# Patient Record
Sex: Female | Born: 1970 | Race: White | Hispanic: No | Marital: Married | Smoking: Never smoker
Health system: Southern US, Community
[De-identification: ages and names within clinical notes are randomized; demographics above are authoritative.]

## PROBLEM LIST (undated history)

## (undated) DIAGNOSIS — N179 Acute kidney failure, unspecified: Secondary | ICD-10-CM

## (undated) DIAGNOSIS — E039 Hypothyroidism, unspecified: Secondary | ICD-10-CM

## (undated) DIAGNOSIS — I10 Essential (primary) hypertension: Secondary | ICD-10-CM

## (undated) DIAGNOSIS — G43909 Migraine, unspecified, not intractable, without status migrainosus: Secondary | ICD-10-CM

## (undated) DIAGNOSIS — J45909 Unspecified asthma, uncomplicated: Secondary | ICD-10-CM

## (undated) HISTORY — DX: Essential (primary) hypertension: I10

## (undated) HISTORY — DX: Acute kidney failure, unspecified: N17.9

## (undated) HISTORY — DX: Hypothyroidism, unspecified: E03.9

## (undated) HISTORY — PX: KNEE SURGERY: SHX244

## (undated) HISTORY — DX: Migraine, unspecified, not intractable, without status migrainosus: G43.909

## (undated) HISTORY — DX: Unspecified asthma, uncomplicated: J45.909

## (undated) HISTORY — PX: CHOLECYSTECTOMY: SHX55

---

## 2003-08-24 ENCOUNTER — Other Ambulatory Visit: Admission: RE | Admit: 2003-08-24 | Discharge: 2003-08-24 | Payer: Self-pay | Admitting: Obstetrics and Gynecology

## 2004-02-27 ENCOUNTER — Other Ambulatory Visit: Admission: RE | Admit: 2004-02-27 | Discharge: 2004-02-27 | Payer: Self-pay | Admitting: Obstetrics and Gynecology

## 2004-04-04 ENCOUNTER — Ambulatory Visit: Payer: Self-pay | Admitting: Internal Medicine

## 2004-04-30 ENCOUNTER — Ambulatory Visit: Payer: Self-pay | Admitting: Internal Medicine

## 2004-05-01 ENCOUNTER — Ambulatory Visit: Payer: Self-pay | Admitting: Internal Medicine

## 2004-05-04 ENCOUNTER — Encounter (INDEPENDENT_AMBULATORY_CARE_PROVIDER_SITE_OTHER): Payer: Self-pay | Admitting: Specialist

## 2004-05-04 ENCOUNTER — Observation Stay (HOSPITAL_COMMUNITY): Admission: AD | Admit: 2004-05-04 | Discharge: 2004-05-05 | Payer: Self-pay | Admitting: Surgery

## 2004-05-07 ENCOUNTER — Inpatient Hospital Stay (HOSPITAL_COMMUNITY): Admission: EM | Admit: 2004-05-07 | Discharge: 2004-05-11 | Payer: Self-pay | Admitting: Surgery

## 2004-05-07 ENCOUNTER — Ambulatory Visit: Payer: Self-pay | Admitting: Internal Medicine

## 2004-05-07 ENCOUNTER — Ambulatory Visit (HOSPITAL_COMMUNITY): Admission: RE | Admit: 2004-05-07 | Discharge: 2004-05-07 | Payer: Self-pay | Admitting: Surgery

## 2004-05-29 ENCOUNTER — Ambulatory Visit: Payer: Self-pay | Admitting: Internal Medicine

## 2004-06-07 ENCOUNTER — Ambulatory Visit (HOSPITAL_COMMUNITY): Admission: RE | Admit: 2004-06-07 | Discharge: 2004-06-07 | Payer: Self-pay | Admitting: Surgery

## 2004-06-12 ENCOUNTER — Ambulatory Visit (HOSPITAL_COMMUNITY): Admission: RE | Admit: 2004-06-12 | Discharge: 2004-06-12 | Payer: Self-pay | Admitting: Internal Medicine

## 2004-06-12 ENCOUNTER — Ambulatory Visit: Payer: Self-pay | Admitting: Internal Medicine

## 2004-09-04 ENCOUNTER — Ambulatory Visit: Payer: Self-pay | Admitting: Internal Medicine

## 2004-10-12 ENCOUNTER — Ambulatory Visit: Payer: Self-pay | Admitting: Internal Medicine

## 2004-10-16 ENCOUNTER — Ambulatory Visit: Payer: Self-pay | Admitting: Cardiology

## 2004-10-30 ENCOUNTER — Emergency Department (HOSPITAL_COMMUNITY): Admission: EM | Admit: 2004-10-30 | Discharge: 2004-10-30 | Payer: Self-pay | Admitting: Emergency Medicine

## 2004-11-02 ENCOUNTER — Other Ambulatory Visit: Admission: RE | Admit: 2004-11-02 | Discharge: 2004-11-02 | Payer: Self-pay | Admitting: Obstetrics and Gynecology

## 2004-11-03 ENCOUNTER — Ambulatory Visit (HOSPITAL_COMMUNITY): Admission: RE | Admit: 2004-11-03 | Discharge: 2004-11-03 | Payer: Self-pay | Admitting: Family Medicine

## 2004-11-11 ENCOUNTER — Encounter: Admission: RE | Admit: 2004-11-11 | Discharge: 2004-11-11 | Payer: Self-pay | Admitting: Neurology

## 2004-12-25 ENCOUNTER — Ambulatory Visit: Payer: Self-pay | Admitting: Internal Medicine

## 2005-04-15 ENCOUNTER — Ambulatory Visit: Payer: Self-pay | Admitting: Gastroenterology

## 2005-04-15 ENCOUNTER — Emergency Department (HOSPITAL_COMMUNITY): Admission: EM | Admit: 2005-04-15 | Discharge: 2005-04-15 | Payer: Self-pay | Admitting: Emergency Medicine

## 2005-04-22 ENCOUNTER — Ambulatory Visit: Payer: Self-pay | Admitting: Internal Medicine

## 2005-04-26 ENCOUNTER — Ambulatory Visit: Payer: Self-pay | Admitting: Internal Medicine

## 2005-04-26 ENCOUNTER — Encounter (INDEPENDENT_AMBULATORY_CARE_PROVIDER_SITE_OTHER): Payer: Self-pay | Admitting: Specialist

## 2005-09-02 ENCOUNTER — Ambulatory Visit (HOSPITAL_COMMUNITY): Admission: RE | Admit: 2005-09-02 | Discharge: 2005-09-02 | Payer: Self-pay | Admitting: Family Medicine

## 2006-01-08 ENCOUNTER — Emergency Department (HOSPITAL_COMMUNITY): Admission: EM | Admit: 2006-01-08 | Discharge: 2006-01-09 | Payer: Self-pay | Admitting: Emergency Medicine

## 2007-04-30 ENCOUNTER — Emergency Department (HOSPITAL_COMMUNITY): Admission: EM | Admit: 2007-04-30 | Discharge: 2007-05-01 | Payer: Self-pay | Admitting: Emergency Medicine

## 2007-05-04 ENCOUNTER — Ambulatory Visit: Payer: Self-pay | Admitting: Gastroenterology

## 2007-05-04 LAB — CONVERTED CEMR LAB
ALT: 31 units/L (ref 0–35)
AST: 38 units/L — ABNORMAL HIGH (ref 0–37)
Albumin: 3.9 g/dL (ref 3.5–5.2)
Basophils Absolute: 0 10*3/uL (ref 0.0–0.1)
Basophils Relative: 0.2 % (ref 0.0–1.0)
GFR calc Af Amer: 104 mL/min
HCT: 40.1 % (ref 36.0–46.0)
MCHC: 33.8 g/dL (ref 30.0–36.0)
Monocytes Relative: 7.6 % (ref 3.0–11.0)
Neutrophils Relative %: 63 % (ref 43.0–77.0)
Potassium: 4.1 meq/L (ref 3.5–5.1)
RBC: 4.13 M/uL (ref 3.87–5.11)
RDW: 13.8 % (ref 11.5–14.6)
Sodium: 135 meq/L (ref 135–145)
Total Bilirubin: 0.8 mg/dL (ref 0.3–1.2)

## 2007-05-12 ENCOUNTER — Ambulatory Visit: Payer: Self-pay | Admitting: Internal Medicine

## 2007-05-13 DIAGNOSIS — F329 Major depressive disorder, single episode, unspecified: Secondary | ICD-10-CM

## 2007-05-13 DIAGNOSIS — R1011 Right upper quadrant pain: Secondary | ICD-10-CM

## 2007-05-13 DIAGNOSIS — R1115 Cyclical vomiting syndrome unrelated to migraine: Secondary | ICD-10-CM | POA: Insufficient documentation

## 2007-05-13 DIAGNOSIS — J309 Allergic rhinitis, unspecified: Secondary | ICD-10-CM | POA: Insufficient documentation

## 2007-05-13 DIAGNOSIS — F411 Generalized anxiety disorder: Secondary | ICD-10-CM | POA: Insufficient documentation

## 2007-05-13 DIAGNOSIS — K589 Irritable bowel syndrome without diarrhea: Secondary | ICD-10-CM

## 2007-05-14 ENCOUNTER — Ambulatory Visit: Payer: Self-pay | Admitting: Internal Medicine

## 2007-05-27 ENCOUNTER — Ambulatory Visit: Payer: Self-pay | Admitting: Internal Medicine

## 2007-05-27 LAB — CONVERTED CEMR LAB
Bilirubin, Direct: 0.2 mg/dL (ref 0.0–0.3)
IgA: 181 mg/dL (ref 68–378)
Tissue Transglutaminase Ab, IgA: 0.3 units (ref <7)
Total Bilirubin: 1 mg/dL (ref 0.3–1.2)
Total Protein: 6.7 g/dL (ref 6.0–8.3)

## 2007-05-31 ENCOUNTER — Emergency Department (HOSPITAL_COMMUNITY): Admission: EM | Admit: 2007-05-31 | Discharge: 2007-05-31 | Payer: Self-pay | Admitting: Emergency Medicine

## 2007-06-03 ENCOUNTER — Ambulatory Visit: Payer: Self-pay | Admitting: Gastroenterology

## 2007-06-03 LAB — CONVERTED CEMR LAB
HCV Ab: NEGATIVE
Hep B S Ab: NEGATIVE

## 2007-06-04 ENCOUNTER — Encounter: Payer: Self-pay | Admitting: Gastroenterology

## 2007-06-16 ENCOUNTER — Ambulatory Visit: Payer: Self-pay | Admitting: Internal Medicine

## 2007-06-19 ENCOUNTER — Encounter: Payer: Self-pay | Admitting: Internal Medicine

## 2007-06-19 LAB — CONVERTED CEMR LAB
5-HIAA, 24 Hr Urine: 1.4 mg/(24.h) (ref <=6.0)
Catecholamines Tot(E+NE) 24 Hr U: 0.085 mg/24hr
Dopamine 24 Hr Urine: 210 mcg/24hr (ref <500)
Epinephrine 24 Hr Urine: 5 mcg/24hr (ref <20)
Metaneph Total, Ur: 468 ug/24hr (ref 115–695)
Metanephrines, Ur: 74 (ref 36–190)
Norepinephrine 24 Hr Urine: 80 mcg/24hr — ABNORMAL HIGH (ref <80)
Normetanephrine, 24H Ur: 394 (ref 35–482)

## 2007-07-06 ENCOUNTER — Ambulatory Visit: Payer: Self-pay | Admitting: Internal Medicine

## 2008-10-26 ENCOUNTER — Ambulatory Visit: Payer: Self-pay | Admitting: *Deleted

## 2008-10-26 ENCOUNTER — Inpatient Hospital Stay (HOSPITAL_COMMUNITY): Admission: AD | Admit: 2008-10-26 | Discharge: 2008-10-31 | Payer: Self-pay | Admitting: Internal Medicine

## 2008-10-28 ENCOUNTER — Encounter (INDEPENDENT_AMBULATORY_CARE_PROVIDER_SITE_OTHER): Payer: Self-pay | Admitting: Interventional Radiology

## 2008-10-28 ENCOUNTER — Encounter (INDEPENDENT_AMBULATORY_CARE_PROVIDER_SITE_OTHER): Payer: Self-pay | Admitting: Internal Medicine

## 2008-11-30 ENCOUNTER — Encounter (INDEPENDENT_AMBULATORY_CARE_PROVIDER_SITE_OTHER): Payer: Self-pay | Admitting: Interventional Radiology

## 2008-11-30 ENCOUNTER — Inpatient Hospital Stay (HOSPITAL_COMMUNITY): Admission: EM | Admit: 2008-11-30 | Discharge: 2008-11-30 | Payer: Self-pay | Admitting: Emergency Medicine

## 2008-12-29 ENCOUNTER — Emergency Department (HOSPITAL_COMMUNITY): Admission: EM | Admit: 2008-12-29 | Discharge: 2008-12-29 | Payer: Self-pay | Admitting: Emergency Medicine

## 2009-05-09 ENCOUNTER — Encounter (INDEPENDENT_AMBULATORY_CARE_PROVIDER_SITE_OTHER): Payer: Self-pay | Admitting: Internal Medicine

## 2009-05-09 ENCOUNTER — Observation Stay (HOSPITAL_COMMUNITY): Admission: EM | Admit: 2009-05-09 | Discharge: 2009-05-11 | Payer: Self-pay | Admitting: Emergency Medicine

## 2009-05-10 ENCOUNTER — Ambulatory Visit: Payer: Self-pay | Admitting: Vascular Surgery

## 2009-05-10 ENCOUNTER — Encounter (INDEPENDENT_AMBULATORY_CARE_PROVIDER_SITE_OTHER): Payer: Self-pay | Admitting: Internal Medicine

## 2010-04-22 ENCOUNTER — Encounter: Payer: Self-pay | Admitting: Internal Medicine

## 2010-06-20 LAB — CK TOTAL AND CKMB (NOT AT ARMC)
CK, MB: 1.5 ng/mL (ref 0.3–4.0)
CK, MB: 1.6 ng/mL (ref 0.3–4.0)
Relative Index: INVALID (ref 0.0–2.5)
Relative Index: INVALID (ref 0.0–2.5)
Total CK: 65 U/L (ref 7–177)
Total CK: 83 U/L (ref 7–177)

## 2010-06-20 LAB — CBC
HCT: 32.6 % — ABNORMAL LOW (ref 36.0–46.0)
Hemoglobin: 10.9 g/dL — ABNORMAL LOW (ref 12.0–15.0)
MCHC: 34.9 g/dL (ref 30.0–36.0)
MCV: 94.6 fL (ref 78.0–100.0)
Platelets: 101 10*3/uL — ABNORMAL LOW (ref 150–400)
Platelets: 95 10*3/uL — ABNORMAL LOW (ref 150–400)
RBC: 3.34 MIL/uL — ABNORMAL LOW (ref 3.87–5.11)
RDW: 16.7 % — ABNORMAL HIGH (ref 11.5–15.5)
WBC: 5.1 10*3/uL (ref 4.0–10.5)
WBC: 5.4 10*3/uL (ref 4.0–10.5)

## 2010-06-20 LAB — DIFFERENTIAL
Lymphocytes Relative: 33 % (ref 12–46)
Monocytes Absolute: 0.3 10*3/uL (ref 0.1–1.0)
Monocytes Relative: 6 % (ref 3–12)
Neutro Abs: 2.9 10*3/uL (ref 1.7–7.7)

## 2010-06-20 LAB — COMPREHENSIVE METABOLIC PANEL
Albumin: 3.6 g/dL (ref 3.5–5.2)
BUN: 12 mg/dL (ref 6–23)
BUN: 9 mg/dL (ref 6–23)
Calcium: 9 mg/dL (ref 8.4–10.5)
Calcium: 9.3 mg/dL (ref 8.4–10.5)
Chloride: 108 mEq/L (ref 96–112)
Creatinine, Ser: 0.75 mg/dL (ref 0.4–1.2)
Creatinine, Ser: 0.76 mg/dL (ref 0.4–1.2)
GFR calc non Af Amer: >60 mL/min (ref >60)
Glucose, Bld: 97 mg/dL (ref 70–99)
Sodium: 138 mEq/L (ref 135–145)
Total Bilirubin: 1 mg/dL (ref 0.3–1.2)
Total Protein: 6.7 g/dL (ref 6.0–8.3)

## 2010-06-20 LAB — AMMONIA: Ammonia: 25 umol/L (ref 11–35)

## 2010-06-20 LAB — URINE CULTURE: Colony Count: 75000

## 2010-06-20 LAB — TROPONIN I
Troponin I: 0.02 ng/mL (ref 0.00–0.06)
Troponin I: 0.05 ng/mL (ref 0.00–0.06)

## 2010-06-20 LAB — LACTATE DEHYDROGENASE: LDH: 278 U/L — ABNORMAL HIGH (ref 94–250)

## 2010-06-20 LAB — IRON AND TIBC
Iron: 89 ug/dL (ref 42–135)
Saturation Ratios: 34 % (ref 20–55)
TIBC: 265 ug/dL (ref 250–470)

## 2010-06-20 LAB — URINALYSIS, ROUTINE W REFLEX MICROSCOPIC
Glucose, UA: NEGATIVE mg/dL
Protein, ur: 30 mg/dL — AB

## 2010-06-20 LAB — LIPID PANEL
Cholesterol: 191 mg/dL (ref 0–200)
LDL Cholesterol: 80 mg/dL (ref 0–99)
Triglycerides: 92 mg/dL (ref <150)

## 2010-06-20 LAB — RETICULOCYTES: Retic Count, Absolute: 64.1 10*3/uL (ref 19.0–186.0)

## 2010-06-20 LAB — URINE MICROSCOPIC-ADD ON

## 2010-06-20 LAB — BASIC METABOLIC PANEL
BUN: 8 mg/dL (ref 6–23)
Calcium: 8.6 mg/dL (ref 8.4–10.5)
GFR calc non Af Amer: >60 mL/min (ref >60)
Glucose, Bld: 92 mg/dL (ref 70–99)
Sodium: 137 mEq/L (ref 135–145)

## 2010-06-20 LAB — TSH: TSH: 3.728 u[IU]/mL (ref 0.350–4.500)

## 2010-06-20 LAB — VITAMIN D 1,25 DIHYDROXY
Vitamin D 1, 25 (OH)2 Total: 96 pg/mL — ABNORMAL HIGH (ref 18–72)
Vitamin D3 1, 25 (OH)2: 96 pg/mL

## 2010-06-20 LAB — TECHNOLOGIST SMEAR REVIEW: Path Review: DECREASED

## 2010-06-20 LAB — ANA: Anti Nuclear Antibody(ANA): NEGATIVE

## 2010-07-06 LAB — COMPREHENSIVE METABOLIC PANEL
ALT: 47 U/L — ABNORMAL HIGH (ref 0–35)
AST: 170 U/L — ABNORMAL HIGH (ref 0–37)
Albumin: 1.8 g/dL — ABNORMAL LOW (ref 3.5–5.2)
Alkaline Phosphatase: 213 U/L — ABNORMAL HIGH (ref 39–117)
CO2: 21 mEq/L (ref 19–32)
Chloride: 93 mEq/L — ABNORMAL LOW (ref 96–112)
GFR calc Af Amer: 22 mL/min — ABNORMAL LOW (ref >60)
GFR calc non Af Amer: 18 mL/min — ABNORMAL LOW (ref >60)
Potassium: 4 mEq/L (ref 3.5–5.1)
Sodium: 129 mEq/L — ABNORMAL LOW (ref 135–145)
Total Bilirubin: 29.5 mg/dL (ref 0.3–1.2)

## 2010-07-06 LAB — DIFFERENTIAL
Basophils Relative: 0 % (ref 0–1)
Eosinophils Absolute: 0 10*3/uL (ref 0.0–0.7)
Eosinophils Relative: 0 % (ref 0–5)
Lymphs Abs: 1.2 10*3/uL (ref 0.7–4.0)
Monocytes Absolute: 1.2 10*3/uL — ABNORMAL HIGH (ref 0.1–1.0)
Neutro Abs: 12.4 10*3/uL — ABNORMAL HIGH (ref 1.7–7.7)
Neutrophils Relative %: 84 % — ABNORMAL HIGH (ref 43–77)

## 2010-07-06 LAB — BODY FLUID CULTURE: Gram Stain: NONE SEEN

## 2010-07-06 LAB — CBC
MCV: 103.3 fL — ABNORMAL HIGH (ref 78.0–100.0)
Platelets: 372 10*3/uL (ref 150–400)
RBC: 3.85 MIL/uL — ABNORMAL LOW (ref 3.87–5.11)
WBC: 14.8 10*3/uL — ABNORMAL HIGH (ref 4.0–10.5)

## 2010-07-06 LAB — BODY FLUID CELL COUNT WITH DIFFERENTIAL
Lymphs, Fluid: 36 %
Total Nucleated Cell Count, Fluid: 12 cu mm (ref 0–1000)

## 2010-07-06 LAB — PATHOLOGIST SMEAR REVIEW

## 2010-07-06 LAB — ALBUMIN, FLUID (OTHER): Albumin, Fluid: <1 g/dL

## 2010-07-06 LAB — CULTURE, BLOOD (ROUTINE X 2)

## 2010-07-07 LAB — CBC
MCHC: 32.9 g/dL (ref 30.0–36.0)
MCV: 109 fL — ABNORMAL HIGH (ref 78.0–100.0)
Platelets: 358 10*3/uL (ref 150–400)
Platelets: 330 10*3/uL (ref 150–400)
WBC: 15 10*3/uL — ABNORMAL HIGH (ref 4.0–10.5)
WBC: 9.8 10*3/uL (ref 4.0–10.5)

## 2010-07-07 LAB — COMPREHENSIVE METABOLIC PANEL
ALT: 42 U/L — ABNORMAL HIGH (ref 0–35)
ALT: 58 U/L — ABNORMAL HIGH (ref 0–35)
AST: 115 U/L — ABNORMAL HIGH (ref 0–37)
AST: 82 U/L — ABNORMAL HIGH (ref 0–37)
AST: 90 U/L — ABNORMAL HIGH (ref 0–37)
Albumin: 1.8 g/dL — ABNORMAL LOW (ref 3.5–5.2)
Albumin: 2.9 g/dL — ABNORMAL LOW (ref 3.5–5.2)
Alkaline Phosphatase: 197 U/L — ABNORMAL HIGH (ref 39–117)
Alkaline Phosphatase: 80 U/L (ref 39–117)
Calcium: 8.8 mg/dL (ref 8.4–10.5)
Calcium: 9.1 mg/dL (ref 8.4–10.5)
Chloride: 91 mEq/L — ABNORMAL LOW (ref 96–112)
Chloride: 98 mEq/L (ref 96–112)
Creatinine, Ser: 2.24 mg/dL — ABNORMAL HIGH (ref 0.4–1.2)
Creatinine, Ser: 0.73 mg/dL (ref 0.4–1.2)
GFR calc Af Amer: 30 mL/min — ABNORMAL LOW (ref >60)
GFR calc Af Amer: >60 mL/min (ref >60)
GFR calc Af Amer: >60 mL/min (ref >60)
Potassium: 3.4 mEq/L — ABNORMAL LOW (ref 3.5–5.1)
Potassium: 3.8 mEq/L (ref 3.5–5.1)
Sodium: 125 mEq/L — ABNORMAL LOW (ref 135–145)
Sodium: 134 mEq/L — ABNORMAL LOW (ref 135–145)
Total Bilirubin: 28 mg/dL (ref 0.3–1.2)
Total Protein: 5.4 g/dL — ABNORMAL LOW (ref 6.0–8.3)
Total Protein: 5.8 g/dL — ABNORMAL LOW (ref 6.0–8.3)

## 2010-07-07 LAB — URINALYSIS, ROUTINE W REFLEX MICROSCOPIC
Nitrite: POSITIVE — AB
Specific Gravity, Urine: 1.017 (ref 1.005–1.030)
Urobilinogen, UA: 1 mg/dL (ref 0.0–1.0)

## 2010-07-07 LAB — AMMONIA: Ammonia: 109 umol/L — ABNORMAL HIGH (ref 11–35)

## 2010-07-07 LAB — DIFFERENTIAL
Basophils Absolute: 0 10*3/uL (ref 0.0–0.1)
Eosinophils Relative: 0 % (ref 0–5)
Lymphocytes Relative: 8 % — ABNORMAL LOW (ref 12–46)
Monocytes Absolute: 0.5 10*3/uL (ref 0.1–1.0)

## 2010-07-07 LAB — URINE CULTURE: Colony Count: 60000

## 2010-07-07 LAB — CULTURE, BLOOD (ROUTINE X 2): Culture: NO GROWTH

## 2010-07-07 LAB — TYPE AND SCREEN: Antibody Screen: NEGATIVE

## 2010-07-07 LAB — DIRECT ANTIGLOBULIN TEST (NOT AT ARMC)
DAT, IgG: NEGATIVE
DAT, complement: NEGATIVE

## 2010-07-08 LAB — COMPREHENSIVE METABOLIC PANEL
ALT: 114 U/L — ABNORMAL HIGH (ref 0–35)
ALT: 73 U/L — ABNORMAL HIGH (ref 0–35)
AST: 113 U/L — ABNORMAL HIGH (ref 0–37)
AST: 88 U/L — ABNORMAL HIGH (ref 0–37)
Albumin: 2.6 g/dL — ABNORMAL LOW (ref 3.5–5.2)
Albumin: 2.9 g/dL — ABNORMAL LOW (ref 3.5–5.2)
Alkaline Phosphatase: 76 U/L (ref 39–117)
Alkaline Phosphatase: 76 U/L (ref 39–117)
Alkaline Phosphatase: 84 U/L (ref 39–117)
BUN: 2 mg/dL — ABNORMAL LOW (ref 6–23)
BUN: 3 mg/dL — ABNORMAL LOW (ref 6–23)
BUN: 4 mg/dL — ABNORMAL LOW (ref 6–23)
BUN: 5 mg/dL — ABNORMAL LOW (ref 6–23)
CO2: 22 mEq/L (ref 19–32)
CO2: 23 mEq/L (ref 19–32)
Calcium: 8.1 mg/dL — ABNORMAL LOW (ref 8.4–10.5)
Chloride: 86 mEq/L — ABNORMAL LOW (ref 96–112)
Chloride: 98 mEq/L (ref 96–112)
Creatinine, Ser: 1 mg/dL (ref 0.4–1.2)
Creatinine, Ser: 1.38 mg/dL — ABNORMAL HIGH (ref 0.4–1.2)
GFR calc Af Amer: >60 mL/min (ref >60)
GFR calc Af Amer: >60 mL/min (ref >60)
GFR calc non Af Amer: 43 mL/min — ABNORMAL LOW (ref >60)
GFR calc non Af Amer: >60 mL/min (ref >60)
Glucose, Bld: 103 mg/dL — ABNORMAL HIGH (ref 70–99)
Glucose, Bld: 90 mg/dL (ref 70–99)
Potassium: 3.7 mEq/L (ref 3.5–5.1)
Potassium: 4.2 mEq/L (ref 3.5–5.1)
Potassium: 4.6 mEq/L (ref 3.5–5.1)
Sodium: 130 mEq/L — ABNORMAL LOW (ref 135–145)
Total Bilirubin: 11.2 mg/dL — ABNORMAL HIGH (ref 0.3–1.2)
Total Bilirubin: 13.1 mg/dL — ABNORMAL HIGH (ref 0.3–1.2)
Total Protein: 4.8 g/dL — ABNORMAL LOW (ref 6.0–8.3)
Total Protein: 5.1 g/dL — ABNORMAL LOW (ref 6.0–8.3)

## 2010-07-08 LAB — DIFFERENTIAL
Basophils Absolute: 0 10*3/uL (ref 0.0–0.1)
Basophils Relative: 0 % (ref 0–1)
Eosinophils Absolute: 0.1 10*3/uL (ref 0.0–0.7)
Eosinophils Relative: 2 % (ref 0–5)
Monocytes Absolute: 0.5 10*3/uL (ref 0.1–1.0)

## 2010-07-08 LAB — VITAMIN B12: Vitamin B-12: 1411 pg/mL — ABNORMAL HIGH (ref 211–911)

## 2010-07-08 LAB — CBC
HCT: 27.7 % — ABNORMAL LOW (ref 36.0–46.0)
Hemoglobin: 9.6 g/dL — ABNORMAL LOW (ref 12.0–15.0)
MCHC: 34.5 g/dL (ref 30.0–36.0)
MCV: 106.3 fL — ABNORMAL HIGH (ref 78.0–100.0)
Platelets: 265 10*3/uL (ref 150–400)
Platelets: 282 10*3/uL (ref 150–400)
RBC: 2.2 MIL/uL — ABNORMAL LOW (ref 3.87–5.11)
RBC: 2.6 MIL/uL — ABNORMAL LOW (ref 3.87–5.11)
WBC: 7.6 10*3/uL (ref 4.0–10.5)
WBC: 8.1 10*3/uL (ref 4.0–10.5)

## 2010-07-08 LAB — LIPID PANEL: VLDL: UNDETERMINED mg/dL (ref 0–40)

## 2010-07-08 LAB — PROTIME-INR
INR: 1 (ref 0.00–1.49)
Prothrombin Time: 13.4 seconds (ref 11.6–15.2)

## 2010-07-08 LAB — IRON AND TIBC
Iron: 163 ug/dL — ABNORMAL HIGH (ref 42–135)
UIBC: <55 ug/dL
UIBC: <55 ug/dL

## 2010-07-08 LAB — OSMOLALITY, URINE: Osmolality, Ur: 62 mOsm/kg — ABNORMAL LOW (ref 390–1090)

## 2010-07-08 LAB — FOLATE RBC: RBC Folate: 2595 ng/mL — ABNORMAL HIGH (ref 180–600)

## 2010-07-08 LAB — BILIRUBIN, FRACTIONATED(TOT/DIR/INDIR): Total Bilirubin: 12.9 mg/dL — ABNORMAL HIGH (ref 0.3–1.2)

## 2010-07-08 LAB — CERULOPLASMIN: Ceruloplasmin: 48 mg/dL (ref 21–63)

## 2010-07-08 LAB — TSH: TSH: 2.598 u[IU]/mL (ref 0.350–4.500)

## 2010-07-08 LAB — ANTI-SMOOTH MUSCLE ANTIBODY, IGG: F-Actin IgG: <20 U (ref <20)

## 2010-07-08 LAB — RETICULOCYTES
RBC.: 2.27 MIL/uL — ABNORMAL LOW (ref 3.87–5.11)
Retic Count, Absolute: 93.1 10*3/uL (ref 19.0–186.0)

## 2010-07-08 LAB — MITOCHONDRIAL ANTIBODIES: Mitochondrial M2 Ab, IgG: <20.1 Units (ref <20.1)

## 2010-07-08 LAB — AMMONIA: Ammonia: 43 umol/L — ABNORMAL HIGH (ref 11–35)

## 2010-08-14 NOTE — Assessment & Plan Note (Signed)
[facility]                         GASTROENTEROLOGY OFFICE NOTE   NAME:[name], [name] DELAGE                     MRN:          [id number]  DATE:06/03/2007                            DOB:          01/30/1971    PROBLEM:  Question medication reaction to Robinul.   HISTORY:  [name] [name] is a very nice 40 year old white female known to Dr.  Leone Payor, who is felt to have probable IBS and GERD.  She had undergone  recent endoscopy in February of 2009 for complaints of abdominal pain  and intermittent vomiting.  Was found to have a 3 cm sliding hiatal  hernia, otherwise normal exam.  She has had prior colonoscopy in January  of 2007 which was normal.  This was done for diarrhea and abdominal  discomfort.  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  She was seen in our office in early February and then again last week by  Dr. Leone Payor.  She had been having increased abdominal discomfort at that  time, right upper quadrant radiating around into her back and her right  flank, and intermittent vomiting.  She had also been complaining of  intermittent dizziness.  At that time, she was not having any difficulty  with diarrhea, was having some vague urinary symptoms and had been  feeling achy all over and queasy.  She had also had chills, but no  documented fever.  She had been seen in the emergency room and had  undergone CT scan of the abdomen and pelvis, which was consistent with a  mild sigmoid diverticulitis, and she was treated with Cipro per the  emergency room physician.  She was also told that she had a urinary  tract infection.   Labs at that time done through the emergency room on January 29 had  shown a WBC of 3.6, platelet count of 52,000, hemoglobin of 13.5,  hematocrit of 38.1, coag's were normal, electrolytes were normal and  liver function studies showed an elevated SGOT of 70, SGPT of 38.  She  did have a urine culture that grew group B  strep 15,000 colonies.  Her  low platelet count and leukopenia were concerning and when she was seen  February 2, CBC was repeated and had normalized with a WBC of 5.3,  hemoglobin 13.5, hematocrit of 40.1, platelet count of 295.  Liver tests  minimally elevated with an SGOT of 38.  She had repeat labs done  February 25 showing SGOT of 85.  No SGPT done at that time.  Repeat labs  on March 1 per Dr. Leone Payor with white count of 4.2, hemoglobin 13,  hematocrit of 37, platelets of 208.  Again elector lytes within normal  limits.  Creatinine 0.62, alk phos 39, SGOT of 76, SGPT of 56.  Mono  spot had been done and was negative. CKs were negative.   In the midst of this she has been having ongoing GI complaints primarily  with right-sided abdominal discomfort, bloating, gas, etc.  She had  called in, had been taking Hyomax previously and this was switched to  Robinul Forte, which she  started last week.  She said after she took a  couple of days' worth of the Robinul, she started to have symptoms of  palpitations and rapid heart beat.  She checked her blood pressure and  pulse at a drugstore, and was found to have a pulse of 115, blood  pressure of 148/100.  She was told to stop the Robinul, her symptoms  persistent, and then the following day she went back to the emergency  room for reevaluation.  She was also feeling somewhat dizzy and  lightheaded at that time.  She had a blood pressure of 171/110 and a  pulse of 129 apparently.  In the emergency room blood pressure was  154/88 and her pulse was approximately 115.  Poison control was called  by the emergency room physician.  It was felt possibly that her symptoms  could have been due to Hyomax, less likely Robinul and she has  discontinued both.  Earlier this week she woke up one morning feeling  that her heart was racing and saw Dr. [name], her primary care physician,  who did an EKG and then started her on a beta blocker Bystolic once   daily.  She said she feels okay today and her blood pressure and her  pulse have come down.  She is concerned how all of her symptoms tie  together.   She is still having intermittent diarrhea, which she says is yellow and  fatty appearing.  She is feeling that she is having a difficult time  regulating her temperature, feeling cold and chilled most of the time,  and then having occasional sweats.  She has been having some mild  swelling in her hands, has also had a 15 pound weight loss over the past  five to six weeks and says that her appetite has been poor.  She also  feels that she is not urinating enough, though she has been drinking a  lot of fluids.  She is having occasional foot cramps and has noted some  intermittent blurry vision.   CURRENT MEDICATIONS:  1. Trazodone 100 at night.  2. Protonix 40 daily.  3. Allegra 180 daily.  4. Birth control pill daily.  5. Nasonex two sprays daily.   ALLERGIES:  AMOXICILLIN WITH HIVES AND CIPRO WITH HIVES.   EXAMINATION:  Well developed, somewhat anxious appearing white female.  Blood pressure 140/90, pulse 76, regular.  Weight is 157.6  CARDIOVASCULAR:  Regular rate and rhythm with S1 and S2.  No murmur, rub  or gallop.  PULMONARY:  Clear to A and P.  ABDOMEN:  Soft.  She is tender in the right lower quadrant.  There is no  guarding or rebound, no palpable mass or hepatosplenomegaly.  Rectal exam was not done today.   IMPRESSION:  1. 40 year old white female with recent episode of      hypertension and tachycardia.  It is unclear whether or not this      was related to medication i.e., Hyomax or glycopyrrolate (Robinul      Forte) as her symptoms persisted several days after stopping the      medication.  2. Multisystem symptom complex of unclear etiology.  3. Persistent mild transaminitis.   PLAN:  1. Check hepatic markers today i.e., hepatitis B and C serologies,      auto-immune markers, sed rate and stool  cultures, stool for O&P,      C&S, WBCs.  2. Continue off of Robinul and off  Hyomax.  Wonder if she could have a      neuroendocrine type      process.  Question carcinoid.  Will discuss further with Dr.      Leone Payor regarding further workup.      [name], PA-C  Electronically Signed      Barbette Hair. Arlyce Dice, MD,FACG  Electronically Signed   AE/MedQ  DD: 06/03/2007  DT: 06/03/2007  Job #: [id number]   cc:   Iva Boop, MD,FACG

## 2010-08-14 NOTE — Assessment & Plan Note (Signed)
[facility]                         GASTROENTEROLOGY OFFICE NOTE   NAME:[name] [name], [name] [name]                     MRN:          [id number]  DATE:05/12/2007                            DOB:          [birth year]    CHIEF COMPLAINT:  Abdominal pain.   [name] [name] was seen by [name] PA-C on May 04, 2007.  She had  been to the ER with abdominal pain in the upper abdomen and a lot of  vomiting.  It started suddenly.  She also had a UTI that was treated.  She had had a low white count and abnormal LFT's.  Repeat showed this to  be all normalized.  She had been to Dr. Brunilda Payor.  He has not been able to  tell her why she has had UTI's, though admittedly she only has 15,000  colonies of Enterococcus in the urine culture of January 29.  CT of the  abdomen and pelvis has been performed that demonstrated some thickening  of the colon and scattered diverticula, and she was diagnosed with  possible diverticulitis and given treatment for that.  She did take  metronidazole and Septra DS.  She has finished those.  At this point she  still has upper abdominal pain.  The nausea is subsiding, and she is  gradually returning to a normal diet, though she vomited a few days ago  still.  Dr. [name] had put her on hyoscyamine 0.375 mg b.i.d., and that  helped for awhile, but it is not working effectively now.  She has not  had a cystoscopy at any point.  She does have difficulty urinating with  small streams and some bladder irritability but no real suprapubic pain.  I think she has had some hematuria on investigation in the past as well.  She thinks that the urinary situation improved while on the antibiotics,  but may be worsening while she is off.  She believes she has passed  kidney stones before, though there has been difficulty finding stones on  imaging.   PAST MEDICAL HISTORY:  1. Irritable bowel syndrome with diarrhea predominant.  Her bowel      habits are somewhat  alternating right now with loose stools and      then no stools.  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  2. History of right upper quadrant pain after treatment of bile duct      leak.  3. Anxiety.  4. Allergic rhinosinusitis.  5. History of depression.  6. Urinary problems as outlined above.  7. Prior left knee arthroscopy.  8. 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  9. Prior laparoscopic cholecystectomy in 2006.  10.ERCP with stenting May 07, 2004.  Stent removed June 12, 2004.   MEDICATIONS:  1. Trazodone.  2. Multivitamin.  3. Hyoscyamine.  4. Protonix.  5. Allegra.  6. Birth control pills.  7. Nasonex spray.  8. Ibuprofen p.r.n.  9. Mucinex p.r.n.  10.Zomig p.r.n.  11.Promethazine p.r.n.  12.Oxycodone APAP p.r.n.   PHYSICAL EXAMINATION:  VITAL SIGNS:  Weight 106 pounds, pulse  96, blood  pressure 136/70.  HEENT:  Eyes anicteric.  LUNGS:  Clear.  HEART:  S1, S2, no rubs or gallops.  ABDOMEN:  Tender in the epigastrium and some in the right lower  quadrant.  There is no organomegaly or mass.  She has right-sided CVA  tenderness.  The ribs, otherwise, are nontender.  PSYCHIATRIC:  She is alert and oriented x3, slightly anxious but  appropriate affect overall.   ASSESSMENT:  1. Syndrome of persistent vomiting.  She may be resolved, but she has      had vomiting and upper abdominal pain.  2. There is clearly a background of irritable bowel syndrome.      Question if this is all a functional disturbance.  3. Positive urine cultures, urinary symptoms etiology not entirely      clear to me.  I had wondered about the possibility of interstitial      cystitis, but I do not think it really fits that pattern      necessarily and she has seen Dr. Brunilda Payor.   PLAN:  Schedule upper GI endoscopy to look for mucosal damage to the  esophagus or stomach which may give [place] a clue.  If this is normal, would  probably try to treat  for functional syndrome still.  May need to change  her antispasmodic.  Consider a change in her PPI, though I would suspect  more of an antispasmodic approach.  Further plans pending that.     Iva Boop, MD,FACG  Electronically Signed    CEG/MedQ  DD: 05/12/2007  DT: 05/14/2007  Job #: [id number]   cc:   Molly Maduro A. [name], M.D.  Lindaann Slough, M.D.

## 2010-08-14 NOTE — Discharge Summary (Signed)
[name] [name], [name] [name]              ACCOUNT NO.:  [id number]   MEDICAL RECORD NO.:  [id number]          PATIENT TYPE:  INP   LOCATION:  1233                         FACILITY:  [facility]   PHYSICIAN:  Richarda Overlie, MD       DATE OF BIRTH:  [birth year]   DATE OF ADMISSION:  11/29/2008  DATE OF DISCHARGE:  11/30/2008                               DISCHARGE SUMMARY   PRIMARY CARE PHYSICIAN:  Dr. Elias Else.   CONSULTATIONS:  Dr. Lowell Guitar, nephrology.   DISCHARGE DIAGNOSES:  1. Hepatic encephalopathy.  2. Hepatorenal syndrome.  3. Hyponatremia.  4. Hypokalemia.  5. Alcohol dependence.  6. Fatty infiltration of the liver.  7. Allergic rhinitis.  8. Depression.  9. Third nerve palsy.   PROCEDURES:  Paracentesis, ultrasound guided, removal of 1.4L of  serosanguineous fluid.  Portable chest x-ray shows low lung volumes and  mild elevation of the right hemidiaphragm.   DIAGNOSTIC STUDIES:  Blood cultures positive for gram-negative rods  drawn on November 29, 2008.  Pan-sensitivity pending.  Hepatic panel shows  a bilirubin of 29.5, alkaline phosphatase 213, AST 170, ALT 47.  INR of  1.4.  Blood ammonia level of 109.  Leukocytosis with a white count of  15.0.  Hemoglobin of 13.3, hematocrit 38.6, and CV 103.6, platelet count  of 358,000.   SUBJECTIVE:  This is a 40 year old female who presents to the ER with a  chief complaint of abdominal pain and abdominal distension associated  with nausea and vomiting.  The patient has been following with Dr. Evette Cristal  of [facility] and was referred to [facility] for a possibility of a transplant  to Dr. Jyl Heinz of Med.  She complained of increasing nausea and vomiting  and dry heaving associated with generalized edema.  The patient was also  found to have a low grade fever in the ER.  The patient presented  initially also found to be hyponatremic and found to be in acute renal  failure with a creatinine of 2.24.  Was also found to have significant  elevation in her  liver function tests.  The patient was admitted for  further evaluation and for possible hepatic encephalopathy and permanent  hepatic failure.   HOSPITAL COURSE:  The patient was admitted to the stepdown unit during  which her blood pressure was completely stable.  Her last vital signs  were documented as 112/67, heart rate of 95, 96% on 2L.  The patient was  treated with p.o. lactulose for her hepatic encephalopathy, diuretics,  namely Lasix and spironolactone, were held because of her renal failure  and her hyponatremia.  I consulted Dr. Lowell Guitar of nephrology for  assistance with her renal failure, which is thought to be possibly due  to hepatorenal syndrome.  The patient received 2 vials of albumin,  50  mL in 25%.  A couple of vials before her discharge.  Of note, the  patient was also found to be bacteremic as likely source of her urine.  She was empirically started on ceftriaxone for possible spontaneous  bacterial peritonitis when the patient was found to be bacteremic,  coverage was changed to Primaxin.  The result of her paracentesis and  ascitic fluid is still pending.   DISCHARGE MEDICATIONS:  1. Albumin.  2. Flonase 2 sprays in each nostril daily.  3. Folic acid 1 mg p.o. daily.  4. Primaxin 250 mg IV q.6.  5. Lactulose 30 mL p.o. t.i.d.  6. Reglan 10 mg IV q.6 p.r.n.  7. Zofran 4 mg IV q.6 p.r.n.  8. Protonix 40 mg IV q.12.  9. Thiamine 100 mg IV daily.  10.Ventolin 2 puffs q.4 hours p.r.n.   DISPOSITION:  The patient is being transferred to [facility] to Dr. Carrolyn Leigh' service who is the accepting physician for possible liver  transplant.  Phone number [phone].  The transplant coordinator is  Eunice Blase [phone].   CODE STATUS:  Full Code.      Richarda Overlie, MD  Electronically Signed     NA/MEDQ  D:  11/30/2008  T:  11/30/2008  Job:  [id number]

## 2010-08-14 NOTE — Discharge Summary (Signed)
[name] [name], [name] [name]              ACCOUNT NO.:  [id number]   MEDICAL RECORD NO.:  [id number]          PATIENT TYPE:  INP   LOCATION:  1305                         FACILITY:  [facility]   PHYSICIAN:  Hollice Espy, M.D.DATE OF BIRTH:  1971/03/27   DATE OF ADMISSION:  10/26/2008  DATE OF DISCHARGE:  10/31/2008                               DISCHARGE SUMMARY   PRIMARY CARE PHYSICIAN:  Molly Maduro A. [name], M.D.   CONSULTANTS ON THIS CASE:  Graylin Shiver, M.D. of [facility].   DISCHARGE DIAGNOSES:  1. 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  2. Fatty infiltration of liver and possible excess alcohol use.  3. Acute renal insufficiency, now resolved.  4. Hyponatremia, now resolved.  5. History of hypertension.  6. History of irritable bowel syndrome.  7. History of a suspected spinal stenosis.   DISCHARGE MEDICATIONS:  For this patient are as follows:   New medicines:  1. OxyIR 5 mg one to two p.o. q.4h. p.r.n. for pain.  2. Over-the-counter laxative for constipation secondary to narcotic      use p.r.n.  3. Phenergan 12.5 p.o. q.8h. p.r.n. for nausea.   For now the patient will stop:  1. Seasonale.  2. Bystolic.  3. Benicar.  4. Naproxen.  5. Vicodin.  6. Lasix.  7. Singulair.  8. She is advised not to take any Tylenol, ibuprofen or aspirin.  9. No alcohol.  10.She recently started on prednisone at an [facility] for      back pain.  This medication too is being discontinued.   The patient will continue on the following medications:  1. Xyzal 5 mg p.o. daily,  2. Nasonex 2 puffs in each nostril daily.  3. Multivitamin p.o. daily.  4. Elavil 50 mg p.o. nightly.  5. Protonix 40 p.o. daily.  6. Zomig 5 mg p.o. b.i.d. p.r.n. for headaches.  7. Lunesta 3 mg p.o. nightly p.r.n. for sleep.  8. Symbicort 2 puffs b.i.d. for allergies.  9. Astepro 0.15% two puffs each nostril daily.  10.Proventil two puffs inhaler every 4-6 hours as needed  for wheezing.   HOSPITAL COURSE:  The patient is a 40 year old white female with a past  medical history of hypertension, cardiac dysrhythmias, migraines and  seasonal rhinitis who had presented after she had several days at home  of having worsening jaundice.  She was admitted to the hospital and  found to have a serum albumin of 3.1, minimal transaminitis, but a total  bilirubin of 15.1.  Her INR was found to be in the normal range.  A CT  abdomen and pelvis then showed severe fatty infiltration of the liver.  There was also that the patient states that she does drink alcohol  during the week.  She had been taking a couple of glasses of wine 2-3  days during the week, but perhaps a little bit more on the weekend.  She  seemed to be quire  vague on the exact amount consumed.  Hepatitis  profile was done which showed negative for viral  types A, B and C.  A  ceruloplasmin level was checked and found be normal.  Serum ammonia was  minimally elevated at 43.  In regard to the patient's liver disease,  Graylin Shiver, M.D. from [facility] was consulted.  He felt this could be  secondary to steatohepatitis, which is fatty infiltration of the liver  causing liver failure.  He also felt this could be due to alcohol liver  disease as well.  He checked iron studies looking for hemochromatosis  and these were negative.  The other possibility of autoimmune hepatitis  and ANA was done which was felt to be unremarkable.  Following these  findings, Graylin Shiver, M.D. again then felt that biopsy would be the  best course of action.  On July 31, interventional radiology performed a  liver biopsy.  The patient tolerated this well.  However, by the evening  of July 2, the patient was having severe pain.  There was a concern  about bile leak as the patient had a previous episode of bile leak  during her gallbladder removal.  Her bilirubin had increased as of  August 1, from 11 up to 14.7.  However, her  symptoms of pain  greatly  improved following medications for pain.  The pain had completely  resolved.  She was having no further pain episodes.  The pathology as of  August 2, is still pending and her bilirubin and started to trend down  from 14.7 down to 14 as of August 2.  The plan will be that the patient  is advised to avoid alcohol and is to work on exercising and eating  properly to help decrease weight in terms of helping with her fatty  infiltration of her liver and once pathology is back she will follow up  with Graylin Shiver, M.D. next week in the [facility] office.  She is felt  to be medically stable at this point.  She is tolerating some p.o.  In  regards for hyponatremia, her diuretic was held.  She was put on gentle  hydration.  She presented with a sodium as low as 111 on admission.  Over the next several days, her sodium continued to improve and as  August 2, the patient's sodium was up to 134.  In regard to the  patient's hypertension, the patient's blood pressure was borderline at  best.  Because of her decreased albumin, poor p.o. intake likely felt to  be secondary to nausea from her elevated bilirubin and fatty  infiltration of her liver, her protein stores have  been somewhat  deplete.  She has a lot of third spacing fluid and her pressure has been  borderline.  Therefore, her Lasix has been held and fluids have provided  little in terms of  blood pressure elevation.  It should be expected  that as the patient's appetite improves as her symptoms subside then she  will resorb much of her third space fluid and her symptoms should  improve.  The patient had minimal renal insufficiency on admission.  Following gentle hydration this improved as well.  The patient had some  constipation secondary to narcotic pain medication given after liver  biopsy.  This was treated with laxatives and this too has improved as  well.   DISPOSITION:  Improved.   ACTIVITY:  Slow to  increase.   DISCHARGE DIET:  Recommended to be a low-fat, low-sodium.   She is being discharged to home.  She  will follow up Graylin Shiver,  M.D. if [facility] in 1 week time.  At that time, her pathology from  biopsy will be followed up.  She will follow up with Molly Maduro A. [name],  M.D. her PCP in the next 2-4 weeks and she is being discharged to home.      Hollice Espy, M.D.  Electronically Signed     SKK/MEDQ  D:  10/31/2008  T:  10/31/2008  Job:  [id number]   cc:   Molly Maduro A. [name], M.D.  Fax: [phone]   Graylin Shiver, M.D.  Fax: [phone]

## 2010-08-14 NOTE — Assessment & Plan Note (Signed)
HEALTHCARE                         GASTROENTEROLOGY OFFICE NOTE   NAME:[name], [name] DEVAUL                     MRN:          [id number]  DATE:06/16/2007                            DOB:          23-Jun-1970    CHIEF COMPLAINT:  Diarrhea, abdominal pain.   [name] [name] comes back stating her right flank pain is somewhat better.  Her diarrhea has worsened and more frequent.  She has been losing  weight.  She was started on glycopyrrolate by me for what I thought was  irritable bowel syndrome, but she developed tachycardia and, I think,  some chest pain and some dizziness and blurry vision, and then was  actually hypertensive.  She saw Dr. [name] and she was put on Bystolic,  and her blood pressure has improved.  She is off the glycopyrrolate.  She indicates that she almost did not come back to see me because she  feels like I am not listening to her.  We talked about that.  She, in my  opinion, has been searching for a diagnosis other than irritable bowel  syndrome.  I explained that irritable bowel syndrome certainly is a  diagnosis of exclusion and that something else could be going on, but  that in a woman who has had bowel complaints and abdominal pain since  her 20s more than likely has irritable bowel syndrome.  She saw [name], [facility] in my absence on June 03, 2007.  Because of right upper  quadrant problems and a history of minor elevations in LFTs in the past,  she had a large number of studies done.  These are summarized as  follows:  Hepatitis B surface antigen, B surface antibodies, C antibody,  antinuclear antibody, antimitochondrial antibody, and anti-smooth muscle  antibody are negative.  Stool cultures negative.  Stool ova and  parasite, negative.  Stool Giardia cryptosporidium, negative.  Stool  lactoferrin, negative.  Her sed rate was 5, her magnesium and phosphorus  was normal, her folate was normal, her B12 level is normal at 246.   She  indicates that pretty much everything she eats tends to run right  through her.  She is having some other pain in the right upper abdomen  at times, and bloating and gaseousness still.  Her medications are  listed and reviewed in the chart.  Her Protonix and Allegra were held  back at the time where she was having these other symptoms.  Her  allergies have recurred and she would like to restart her Allegra.  She  does not feel like she is missing her Protonix.  She is also on  trazodone and Nasonex, and now on Bystolic, Zomig p.r.n., Vicodin p.r.n.   PAST MEDICAL HISTORY:  Reviewed and unchanged from previous note.  She  has also had a tissue transglutaminase antibody that is negative with a  normal IgA level.  She did have some constipation when she started the  glycopyrrolate.   Past medical history includes chronic, recurrent right upper quadrant  pain, irritable bowel syndrome, allergic rhinitis, anxiety (I explained  to her that I did not think her  anxiety was a causative agent here, but  that she did have some anxiety).  She has had a history of depression as  well.  Status post cholecystectomy with bile leak and subsequent  stenting for treatment.  Esophagogastroduodenoscopy May 14, 2007,  hiatal hernia otherwise normal.  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  Endoscopic retrograde cholangiopancreatography, May 07, 2004, when  she had the stent placed, and then she had stent removal June 12, 2004.   Weight 158 pounds which is stable from June 03, 2007.  On May 27, 2007 her weight was 160.  On May 12, 2007 her weight was 160.  On  July 2006 her weight was 141.  On January of 2007 it was 154, so she  really, if anything, gained weight over time, but has been stable in the  last couple of months.   ASSESSMENT:  I think that [name] [name] clearly has irritable bowel syndrome,  currently in a diarrhea  predominant phase.  As some irritable bowel  syndrome patients do, she wonders since her diarrhea symptoms are  severe, and there has been lack of good, efficacious therapy over time,  as to another cause.  I do not know why her blood pressure was up,  though I suspect her tachycardia and some of her other symptoms are  related to an intolerance to glycopyrrolate.  Her blood pressure is now  normal.  Whether or not she needs ongoing anti-hypertensive therapy, I  would have to defer to Dr. [name].   PLAN:  1. I will go ahead and check a urine for 5HIAA, metanephrines and      catecholamines.  If this were positive, we could follow up further.      Given that she had this episode of hypertension, I think this is      reasonable, though I suspect this will be normal and I have told      her that.  2. Colestid 4 g b.i.d. with lunch and supper for her diarrhea.  3. I have asked her to consider taking Lotronex.  I have reviewed with      her the side effects, including ischemic colitis and that people      died when taking the drug, though in my estimation, I think that      most, if not all of those people, were patients that should have      never received treatment with the drug.  She has a copy of the      permission form, and we will let her know pending the results of      the studies.   ADDENDUM:  I need to note that [name] [name] did have some abnormal  transaminases that she has had in the past. They were elevated when she  was at the emergency room with an AST of 76 and an ALT of 56. She has  had a negative serologic evaluation as described above in the body of  the report. The etiology of this is not entirely clear. I think it would  be unlikely to be related to her diarrhea symptoms. Fatty liver is  possible, versus medications.   Regarding the problems with glycopyrrolate, she was taking HyoMax at the  same time. That was not my intent for her to take both of those  medications and  when I had seen her, the intent was for the  glycopyrrolate to replace the  HyoMax, and so, the combination of those  medicines was probably the reason she had such trouble. Regarding the  increased transaminases, she may need a workup with a CPK level and an  aldolase level, since she had so many musculoskeletal complaints as  well.     Iva Boop, MD,FACG  Electronically Signed    CEG/MedQ  DD: 06/16/2007  DT: 06/16/2007  Job #: [id number]   cc:   Molly Maduro A. [name], M.D.  Wynona Canes, M.D.

## 2010-08-14 NOTE — Assessment & Plan Note (Signed)
[name] [name]                         GASTROENTEROLOGY OFFICE NOTE   NAME:[name] [name], [name] [name]                     MRN:          [id number]  DATE:05/04/2007                            DOB:          Feb 06, 1971    PROBLEM:  Right upper quadrant pain radiating into the back, nausea,  vomiting, fatigue.   HISTORY:  [name] [name] is a pleasant 40 year old white female known to Dr.  Leone Payor who has been evaluated for abdominal pain and diarrhea, felt to  have irritable bowel syndrome.  She is also status post cholecystectomy,  which was complicated by a bile duct leak.  This was in 2006.  She was  last seen here in January 2007.   The patient said her current symptoms had onset on April 30, 2007,  with midabdominal, right abdominal, and right back discomfort.  She says  she has been having some pain in her right back off and on since  December 2009.  On Thursday April 30, 2007, this was associated with a  sick feeling all over, onset of fatigue, some mild dizziness, shivering,  but no fever.  She developed queasiness, but initially did not have any  vomiting.  She presented to the emergency room and was evaluated on  April 30, 2007, when her symptoms persisted.  She was found to have a  white count of 3.6, hemoglobin 13.5, hematocrit of 38.1, platelets  52,000.  Pro time 12.8, INR 0.9.  Electrolytes within normal limits.  Creatinine 0.69.  LFTs were normal, with the exception of an OT at 70,  SGPT of 38, albumin 3.5, lipase was 18.  UA showed 0-2 WBCs, 0-2 RBCs.  She had a urine culture done which is now growing 15,000 colonies of  Enterococcus and group B Streptococcus.  This is pansensitive.   She also had a CT scan of the abdomen and pelvis done and was given a  diagnosis of diverticulitis.  The CT scan with IV contrast showed a  normal appearance of her liver, spleen, pancreas, and kidneys,  gallbladder absent, no ductal dilation, no evidence for retained  stones.  There was some mild to moderate mucosal thickening of the proximal  transverse and ascending colon, scattered diverticula.  No transmural  inflammation, and very  mild similar changes in the sigmoid colon, with  mucosal edema.  She was treated for diverticulitis and placed on a  course of Flagyl 500 t.i.d., and Bactrim DS b.i.d., as she is allergic  to amoxicillin and Cipro.  She says she is not particularly feeling  better.  She did get a prescription for Phenergan and says that that has  helped with her nausea.  She did have a couple of episodes of vomiting  over the weekend, has been able to keep down some p.o.'s today in the  form of liquids and Boost.  She is still having discomfort in her right  side.  Her bowel movements have been fairly normal.  She had been having  some vague urinary symptoms, but says that those have improved, and her  generalized aching is improved as well.  She said she  had a rash last  Thursday evening as well which was transient.  She denies any  arthralgias, headache, sore throat, etc.   CURRENT MEDICATIONS:  1. Trazodone 100 mg at bedtime.  2. Multivitamin daily.  3. Levbid p.r.n.  4. Protonix 40 mg daily.  5. Allegra 180 daily.  6. Nasonex daily.  7. Flagyl 500 t.i.d.  8. Bactrim DS b.i.d.  9. Promethazine p.r.n.  10.Hydrocodone p.r.n.   ALLERGIES:  AMOXICILLIN and CIPRO, both of which cause hives.   PHYSICAL EXAMINATION:  GENERAL:  Well-developed white female in no acute  distress.  VITAL SIGNS:  Blood pressure is 122/82, pulse is 88, weight is 166.  HEENT:  Nontraumatic, normocephalic.  EOMI.  PERRLA.  Sclerae anicteric.  CARDIOVASCULAR:  Regular rate and rhythm, with S1 and S2.  No murmur,  rub, or gallop.  PULMONARY:  Clear to A&P.  ABDOMEN:  Soft.  There is no palpable mass or hepatosplenomegaly.  She  is mildly tender in the right upper quadrant, and also into the right  back, with some CVA tenderness on the right side.   RECTAL:  Not done today.  EXTREMITIES:  Without clubbing, cyanosis, or edema.  No rash.   IMPRESSION:  54. A 40 year old white female with 5-day illness, as described above,      being treated for diverticulitis with Bactrim and Flagyl.  It is      not clear to me that she actually has diverticulitis or that that      explains all of her symptoms.  Wonder if she has an underlying      viral syndrome, mono, etc., with her transaminitis and      thrombocytopenia.  2. Probable urinary tract infection.   PLAN:  1. Continue course of Bactrim and Flagyl to complete 10 days.  2. Phenergan 12.5-25 q.6 h. p.r.n. nausea and vomiting, #20, and 1      refill.  3. Repeat CBC with differential and CMET today, and depending on      trend, will decide if she needs any further hepatic workup.  4. Check mono spot.  5. The patient has follow-up appointment with Dr. Leone Payor next week,      and she is encouraged      to keep this.  She was advised should her symptoms worsen over the      course of this week to call us back for reassessment.     [name], PA-C  Electronically Signed      Barbette Hair. Arlyce Dice, MD,FACG  Electronically Signed   AE/MedQ  DD: 05/04/2007  DT: 05/05/2007  Job #: [id number]   cc:   Iva Boop, MD,FACG

## 2010-08-14 NOTE — Assessment & Plan Note (Signed)
[facility]                         GASTROENTEROLOGY OFFICE NOTE   NAME:Farish, HARBOUR NORDMEYER                     MRN:          [id number]  DATE:07/06/2007                            DOB:          09/29/1970    CHIEF COMPLAINT:  Rectal bleeding.   HISTORY:  The patient has been having some rectal bleeding off an on.  It is bright red blood on the toilet paper.  There has been some on or  perhaps even in the stool.  There is no mucous.  Her diarrhea is better,  less than 20 times a day now 4-6 times a day on the Colestid.  She had a  24-hour urine studies for 5-HIAA and catecholamines and metanephrines  and these were negative.  This has been called to her.  Her stool  studies have been negative.  She has mild elevation in transaminases and  that has been seen.  She has seen a nephrologist at [facility].  She has a  pending appointment at the [facility] as well for a second  opinion.  I have told her I think she has irritable bowel syndrome and  that she might need Lotronex (though at this point she is actually doing  better on the Colestid 4 grams b.i.d.)  Hepatitis B surface antigen,  surface antibody, C antibody, antinuclear antibody, mitochondrial  antibody, smooth muscle antibody were all unrevealing i.e., negative.  Tissue transglutaminase antibody is negative as well with respect to her  LFT workup.  IgA level was normal.   PHYSICAL EXAMINATION:  VITAL SIGNS:  No fever or chills at this time.  Weight stable at 156 pounds.  Pulse 72, blood pressure 108/68.   Anoscopy is performed after rectal examination reveals no mass.  Female  medical staff present.  She has internal hemorrhoids that are inflamed  in the canal.   ASSESSMENT:  1. Irritable bowel syndrome, diarrhea predominant.  2. Internal hemorrhoids.  I had called in AnaMantle which is helping      some, but not relieving things.  She is mildly tender, I should      say, but there  is no evidence for fissure.  3. Abnormal transaminases, unclear etiology, question a non liver      issue.  She did have this problem where she has a lot of aches and      pains though that is over.   PLAN:  1. Increase Colestid to 5 grams b.i.d. she says is helping diarrhea.  2. Canasa suppositories 1000 mg  nightly for 2 weeks and then as      needed as adjunctive therapy for these hemorrhoids.  3. Keep follow-up with [facility] GI appointment and second opinion.  4. Further plans pending that.     Iva Boop, MD,FACG  Electronically Signed    CEG/MedQ  DD: 07/06/2007  DT: 07/06/2007  Job #: [id number]

## 2010-08-14 NOTE — H&P (Signed)
NAMEMIKAEL, DEBELL              ACCOUNT NO.:  [id number]   MEDICAL RECORD NO.:  [id number]          PATIENT TYPE:  INP   LOCATION:  1233                         FACILITY:  [facility]   PHYSICIAN:  Kela Millin, M.D.DATE OF BIRTH:  September 06, 1970   DATE OF ADMISSION:  11/29/2008  DATE OF DISCHARGE:  11/30/2008                              HISTORY & PHYSICAL   PRIMARY CARE PHYSICIAN:  Molly Maduro A. [name], M.D.   CHIEF COMPLAINT:  Abdominal pain and confusion.   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  She was hospitalized  earlier in August and had a liver biopsy that was consistent with  steatohepatitis and at the time of her discharge her total bilirubin was  14.7.  For the past few weeks she states she has had nausea, dry  heaving, confusion, and increasing generalized weakness.  She has also  had increasing abdominal girth and swelling in her legs.  Today she  developed increasing generalized abdominal pain and so came to the ED.  She denies fevers, diarrhea, melena, and no hematochezia.  She also  denies chest pain, shortness of breath, melena, and no hematochezia.   She was seen in the ED and lab work was done which revealed an ammonia  level of 109.  Her white cell count was 15 with a hemoglobin of 13.3,  hematocrit of 38.6, and platelet count of 358.  Her sodium was 125 with  a potassium of 3.4,her BUN was 31 with a creatinine of 2.24 (her last  creatinine upon discharge earlier in August was 0.80).  Her INR was  found to be 1.4 and her total bilirubin 28.  Her urinalysis showed large  bilirubin turbid, urine nitrite positive, small leukocyte esterase at 21-  50 WBCs and many bacteria.  Her white  cell count was elevated at 15.  The ED physician discussed the patient with Dr. Ewing Schlein and following that  she spoke with the transplant coordinator at [facility], who stated that there  was no indication for the patient to be transferred to their facility at  this time.  She is admitted for further evaluation and management.   PAST MEDICAL HISTORY:  1. As above.  2. History of migraine headaches.  3. History of allergies.   MEDICATIONS:  1. Xyzal 5 mg daily.  2. Nasonex two puffs per nostril daily.  3. Elavil 50 mg at bedtime.  4. Protonix 40 mg daily.  5. Zomig 5 mg p.o. b.i.d. p.r.n. headaches.  6. Lunesta 3 mg p.o. at bedtime p.r.n.  7. Symbicort two puffs b.i.d.  8. Astepro 0.15% one puff in nostril daily.  9. Oxy IR 5 mg q.6 h p.r.n.  10.Proventil.   ALLERGIES:  1. CIPRO.  2. AMOXICILLIN.   SOCIAL HISTORY:  She denies tobacco.  She states she has not had any  alcohol in the past  six weeks.   FAMILY HISTORY:  Negative for liver disease.   REVIEW OF SYSTEMS:  As per HPI, other review of systems negative.   PHYSICAL EXAMINATION:  GENERAL: The patient is jaundiced, somnolent, but  easily arouses and oriented x3 and answers simple questions  appropriately.  She is in no respiratory distress.  VITAL SIGNS:  Her temperature is 98.6, blood pressure is 126/73,  initially 96/64.  Her pulse is 94, respiratory rate is 18, O2 sat 94%.  HEENT:  PERRL, EOMI.  Icteric sclerae, no oral exudates.  NECK:  Supple, no adenopathy and no thyromegaly and no JVD.  LUNGS:  Decreased breath sounds at the bases, no wheezes.  No crackles.  CARDIOVASCULAR:  Regular rate and rhythm.  Normal S1/S2.  ABDOMEN:  Distended, flank fullness present in this fluid wave present.  Diffuse tenderness, no rebound tenderness.  Bowel sounds present.  EXTREMITIES:  There is +1-2 edema present.   LABORATORY DATA:  As per HPI.  Also her hemoglobin is 13.3 with a  hematocrit of 38.6, platelet count is 358, MCV is  103.6.  Her PTT is 33,  her INR is 1.4.   ASSESSMENT/PLAN:  1. Abdominal pain with ascites.  Cannot rule out spontaneous bacterial      peritonitis.  Will order paracentesis per interventional radiology,      empiric antibiotics.  The ED discussed the patient with Dr. Ewing Schlein      and he agreed with the above.  Will not be aggressive with      diuretics at this time given her renal impairment but will attempt      Spironolactone for now.  Monitor closely and further manage      accordingly pending clinical course.  Consult GI later this morning      for further recommendations.  2. Urinary tract infection.  Will obtain urine cultures, antibiotics      as above, and follow.  3. Hepatic encephalopathy.  Infection likely precipitating factor.      Ammonia level is 109, will place on lactulose, and as already      mentioned, GI consult later this a.m. for further recommendations.  4. Acute renal failure.  Intravascular volume depletion versus      hepatorenal are possibilities.  Will hold off IV fluids for now      secondary to increased ascites.  Monitor and further manage      accordingly.  5. Hyponatremia.  Again as above.  Volume overload secondary to liver      disease versus intravascular volume depletion.  Follow recheck and      if worsening, obtain urine electrolytes and TSH and further manage      accordingly.  6. History of migraine headaches.  Continue outpatient medications.  7. History of seasonal allergies.  Continue outpatient medications.      Kela Millin, M.D.  Electronically Signed     ACV/MEDQ  D:  11/30/2008  T:  12/01/2008  Job:  [id number]   cc:   Molly Maduro A. [name], M.D.  Graylin Shiver, M.D.

## 2010-08-14 NOTE — Consult Note (Signed)
NAMEBARBIE, CROSTON NO.:  [id number]   MEDICAL RECORD NO.:  [id number]          PATIENT TYPE:  INP   LOCATION:  1239                         [facility]ACILITY[facility]   PHYSICIAN:  Graylin Shiver, M.D.   DATE OF BIRTH:  1971/02/24   DATE OF CONSULTATION:  10/27/2008  DATE OF DISCHARGE:                                 CONSULTATION   REASON FOR CONSULTATION:  The patient is a 40 year old female who we are  being asked to see in regards to jaundice which according to the patient  was noticed by her husband about 3-4 days ago.   HISTORY:  The patient was admitted to the hospital yesterday and total  bilirubin was found to be 15.1, alkaline phosphatase 77, AST 134, ALT  114, serum albumin was low at 3.1 and prothrombin time was in the normal  range of 13.4.  A CT of the abdomen was done which showed severe fatty  infiltration of the liver.  The patient states that she does drink  alcohol and states that during the week she will drink a couple of  glasses of wine perhaps 2-3 days during the week, but then she will  drink a little more on the weekends.  She states that she has gained 18  pounds in the past 2 months.  She denies excessive use of Tylenol.  There is no history of diabetes.  There is no family history of liver  disease to her knowledge.  In Dr. Benjaman Pott office, a hepatitis profile  was obtained which was negative for types A, B and C.  A ceruloplasmin  and has been checked and is normal.  Her serum ammonia is elevated  somewhat at 43.   ALLERGIES:  CIPRO and AMOXICILLIN both of which cause hives.   PAST HISTORY:  1. Hypertension.  2. History of carpal tunnel syndrome.  3. Cardiac dysrhythmias.  4. Irritable bowel syndrome.  5. Kidney stones.  6. Migraines.  7. Allergic rhinitis.  8. Depression.  9. Third nerve palsy.   PAST SURGICAL HISTORY:  1. Cholecystectomy in 2006.  2. She had a bile leak and had an ERCP with stent placed which was  subsequently removed.  She did have gallstones.  3. Eye surgery.  4. Arthroscopic knee surgery.   SOCIAL HISTORY:  Does not smoke.  Alcohol history as stated above.   FAMILY HISTORY:  Negative for liver disease.   MEDICATIONS PRIOR TO ADMISSION:  1. Amitriptyline.  2. Protonix.  3. Seasonale.  4. Zomig.  5. Bystolic.  6. Lunesta.  7. Naprosyn.  8. Benicar.  9. Symbicort.  10.Singulair.  11.AstraPro.  12.Proventil.  13.Xyzal.  14.Nasonex.  15.Furosemide, started a couple of days ago.  16.Also prednisone which was started a couple of days ago.  17.Vicodin.   REVIEW OF SYSTEMS:  No complaints of chest pain, shortness of breath,  cough or sputum production.   PHYSICAL EXAMINATION:  GENERAL:  She is alert and oriented.  She is  jaundiced.  VITAL SIGNS:  Stable.  NECK:  Supple.  HEART:  Regular rhythm.  No murmurs.  LUNGS:  Clear.  ABDOMEN:  Soft, nontender.   IMPRESSION:  Jaundice.  This could be secondary to steatohepatitis.  It  could also be due to alcoholic liver disease.  Other considerations  would be hemochromatosis.  I will check serum iron studies.  Another  consideration would be autoimmune hepatitis.  We will check an ANA.  If  all of these blood tests and serologies do not point to a specific  disease process, then I think she will probably need a liver biopsy to  confirm steatohepatitis or alcoholic liver disease.           ______________________________  Graylin Shiver, M.D.     SFG/MEDQ  D:  10/27/2008  T:  10/27/2008  Job:  [id number]   cc:   [facility]   Robert A. [name], M.D.  Fax: [phone]

## 2010-08-14 NOTE — Assessment & Plan Note (Signed)
[facility]                         GASTROENTEROLOGY OFFICE NOTE   NAME:[name], [name]                     MRN:          [id number]  DATE:05/27/2007                            DOB:          02-16-1971    CHIEF COMPLAINT:  Followup of diarrhea, abdominal pain.   [name] [name] is still having chronic right upper quadrant and flank pain  that is fairly constant.  New problems include stinging diffuse  abdominal pains that last for seconds, and a left upper quadrant  discomfort.  She is having watery bowel movements one to two hours after  meals.  She saw Dr. Lindley Magnus in [facility] for second opinion regarding her  urologic symptoms and is due for a cystoscopy today.  There is no fever,  bleeding or vomiting.  She is concerned that we could be missing  something.  She saw her gynecologist, who told her her right upper  quadrant pain sounded like the liver or gallbladder (she is status post  cholecystectomy).   She had an EGD, May 14, 2007, showing a 3 cm hiatal hernia,  otherwise negative.  She was just seen on May 12, 2007, by me, and  May 04, 2007, by [employer].  Details of the past medical history  are reviewed and unchanged from that.   NOTE:  These symptoms that she is having really are chronic recurrent  problems with some occasional new features mixed in.   MEDICATIONS:  Are listed and reviewed in the chart.  She is on trazodone  to sleep, hyoscyamine, Protonix, Allegra, birth control pills, Nasonex,  ibuprofen p.r.n., Mucinex, Zomig, promethazine, oxycodone and Vicodin  intermittently.   She is allergic to AMOXICILLIN and CIPRO.   PHYSICAL EXAM:  Reveals an almost-tearful white woman, in no acute  distress.  Weight 160 pounds, pulse 96, blood pressure 150/88.  The eyes are anicteric.  There is a slight right ptosis, as seen before.  The neck is supple.  The back is exquisitely tender in the right flank and upper quadrant  area to both light touch and percussion.  There is a hyperesthesia here.  The same in the epigastric area.  She is mildly tender in the lower  quadrants.  The abdomen is soft and benign.  She is alert and oriented times three.   ASSESSMENT:  1. Chronic right flank and upper quadrant pain that preceded and      persisted after cholecystectomy and bile duct leak.  CT scanning of      the abdomen negative, January 2009.  2. Irritable bowel syndrome.  She did have some thickening of the      bowel in places, which I think was spasm, versus a recent      gastroenteritis, infectious colitis type syndrome, which appears      resolved, in my opinion.  She is having postprandial diarrhea.  3. Bladder symptoms.  She is not urinating well until the evening,      despite drinking lots of water.  She is to have a cystoscopy today.      Question interstitial cystitis.  4. Underlying anxiety  issues.   PLAN:  1. I will go ahead and recheck LFTs, just to be certain they are not      abnormal anymore.  She had some trivial elevation in the past.  2. Tissue transglutaminase antibody and IgA level to look for possible      celiac disease, though seems highly unlikely.  I think she really      has irritable bowel syndrome.  3. Discontinue hyoscyamine and start glycopyrrolate 2 mg b.i.d.  4. Lidoderm patch to the right upper quadrant and flank.  5. Return to see me in four to six weeks, after urologic workup      performed.  6. 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  She may need regular NSAIDs or a pain      evaluation.  I do not think she has a radicular problem in the      thoracic spine, though that is possible.  7. Consider other antidepressant therapy, like Cymbalta, versus a      tricyclic agent, different from the Trazodone.   Approximately 25 minutes of time were spent with  the patient today, over  half of which was in counseling and coordination of care.   Also consider psychology referral to help with these problems, as well.  This may prove useful.     Iva Boop, MD,FACG  Electronically Signed    CEG/MedQ  DD: 05/27/2007  DT: 05/27/2007  Job #: [id number]   cc:   Wynona Canes, M.D.  Robert A. [name], M.D.

## 2010-08-14 NOTE — Assessment & Plan Note (Signed)
[facility]                         GASTROENTEROLOGY OFFICE NOTE   NAME:[name], [name] TREMBATH                     MRN:          [id number]  DATE:06/16/2007                            DOB:          [birth year]    ADDENDUM:  I need to note that [name] did have some abnormal  transaminases that she has had in the past. They were elevated when she  was at the emergency room with an AST of 76 and an ALT of 56. She has  had a negative serologic evaluation as described above in the body of  the report. The etiology of this is not entirely clear. I think it would  be unlikely to be related to her diarrhea symptoms. Fatty liver is  possible, versus medications.   Regarding the problems with glycopyrrolate, she was taking HyoMax at the  same time. That was not my intent for her to take both of those  medications and when I had seen her, the intent was for the  glycopyrrolate to replace the HyoMax, and so, the combination of those  medicines was probably the reason she had such trouble. Regarding the  increased transaminases, she may need a workup with a CPK level and an  aldolase level, since she had so many musculoskeletal complaints as  well.     Iva Boop, MD,FACG     CEG/MedQ  DD: 06/16/2007  DT: 06/16/2007  Job #: [id number]   cc:   Molly Maduro A. [name], M.D.

## 2010-08-14 NOTE — H&P (Signed)
[name] [name], [name] [name]              ACCOUNT NO.:  [id number]   MEDICAL RECORD NO.:  [id number]          PATIENT TYPE:  INP   LOCATION:  1239                         FACILITY:  [facility]   PHYSICIAN:  Corinna L. Lendell Caprice, MDDATE OF BIRTH:  01/20/1971   DATE OF ADMISSION:  10/26/2008  DATE OF DISCHARGE:                              HISTORY & PHYSICAL   CHIEF COMPLAINT:  Back pain with difficulty walking and fatigue.   HISTORY OF PRESENT ILLNESS:  [name] [name] is a 40 year old female who notes  sciatic pain intermittently for 4-6 weeks.  She also reports positive  nausea since Sunday October 23, 2008, on October 25, 2008, she developed  dizziness and blurred vision.  She notes increased swelling of her  bilateral lower extremity since Monday, although today, she notes it is  a touch better.  She also notes a dark orange color urine which she  attributed to carrot juice.  She denies fever, chills or shortness of  breath.  She notes bilateral upper quadrant pain, sometimes stabbing,  sometimes tingling in nature.  She was sent over here for direct  admission from her primary care MD   ALLERGIES:  CIPRO CAUSES HIVES.  AMOXICILLIN CAUSES HIVES.   PAST MEDICAL HISTORY:  1. Hypertension.  2. Carpal tunnel syndrome.  3. Cardiac dysrhythmias.  4. Irritable bowel syndrome.  5. Kidney stones.  6. Migraines.  7. Allergic rhinitis.  8. Depression.  9. Third nerve palsy.   PAST SURGICAL HISTORY:  1. Arthroscopic knee surgery 1998.  2. Eye surgery in 2003 to shorten muscle secondary to third nerve      palsy.  3. Cholecystectomy 2004.  4. Biliary stent placement secondary to bile leak post cholecystectomy      performed by Dr. Leone Payor.   SOCIAL HISTORY:  The patient is married.  No children.  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  She is a nonsmoker.  She reports one to two glass of wine several times per week and  occasional extra cocktail on the weekends.   FAMILY  HISTORY:  Her mother is living with glaucoma and has history of  mental illness.  Her dad is living with diabetes, hypertension.  She has  three sisters.  One has a history of a brain tumor.  Another sister has  history of gallstones and kidney stones as well as some back problems  and a third sister who is alive and well.  She also has a brother who is  alive and well.   MEDICATIONS:  1. Amitriptyline 50 mg p.o. q.h.s.  2. Protonix 40 mg p.o. daily.  3. Seasonal 0.15-0.03 mg p.o. daily.  4. Zomig 5 mg p.o. b.i.d. p.r.n. headache.  5. Bystolic 10 mg p.o. daily.  6. Lunesta 3 mg p.o. q.h.s.  7. Naproxen 550 mg p.o. b.i.d. as needed for pain.  8. Benicar 20 mg p.o. daily.  9. Symbicort 80/4.5 two puffs twice daily.  10.Singulair 10 mg p.o. daily.  11.AstraPro 0.15% 2 puffs each nostril daily.  12.Proventil MDI 2 puffs q.4-6 hours p.r.n. wheezing.  13.Xyzal 5  mg p.o. q.h.s.  14.Nasonex 50 mcg per actuation 2 puffs in each nostril daily.  15.Furosemide 40 mg p.o. daily.  This was started yesterday.  16.Prednisone 60 mg p.o. daily.  This was started 2 days ago.  She did      not take a dose today.  17.Vicodin 5/500 1-2 tablets p.o. q.4-6 hours p.r.n. pain.   REVIEW OF SYSTEMS:  GENERAL:  Notes an 18-pound weight gain over the  last week with approximately 10 pounds in the last several days.  She  notes positive lightheadedness and fatigue.  CARDIOVASCULAR:  No lower  extremity swelling.  Denies chest pain or palpitations.  RESPIRATORY:  Denies shortness of breath.  She does have positive cough which she  attributes to postnasal drip.  She did note having a stomach virus early  in June.  GI:  She notes positive constipation.  GU:  Notes orange  urine.  PSYCHIATRIC:  Denies depression or anxiety.  SKIN:  Denies  rashes.  She does note mild jaundice which started on Monday July 26.  NEURO:  She notes sciatic pain with some pain in tingling beneath her  rib cage.  She also has recently  had some blurred vision.  MUSCULOSKELETAL:  Positive low back pain for which she was recently  treated with prednisone.  She also notes increased discomfort in her  jaws.  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  ENT: Chronic nasal congestion.   LABORATORIES/RADIOLOGY:  Sodium 116, potassium 4.4, chloride 80, bicarb  22, BUN 5, creatinine 1.38, glucose 103, hemoglobin 9.6, hematocrit  27.7, white blood cell count 13.3, platelet 271.  TSH in the office  yesterday was 5.3.  Acute hepatitis panel was negative in the office  yesterday.  Total bili today is 15.1, AST 134, ALT 114.  Urinalysis  notes trace protein, +2 bili and negative nitrate, negative leukocytes,  negative blood in the office yesterday.   PHYSICAL EXAMINATION:  VITAL SIGNS:  BP 98/65, heart rate 85,  respiratory rate 18, temperature 97.9, O2 sat 100% on room air.  HEAD:  Normocephalic, atraumatic.  GENERAL:  Obese white female, positive jaundice, no acute distress.  NECK:  Thick.  No palpable masses.  HEENT:  ENT:  Moist oral mucosa.  EYES:  Positive scleral icterus is  noted.  CARDIOVASCULAR:  S1-S2, regular rate and rhythm, 1+ bilateral lower  extremity edema.  RESPIRATORY:  Breath sounds with auscultation bilaterally without  wheezes, rales or rhonchi.  No increased work of breathing.  ABDOMEN:  Distended, suspect underlying ascites.  Her liver edge is  approximately three fingerbreadths beneath her rib cage per scratch  test.  Unable to palpate secondary to a distention.  Positive mild right  upper quadrant left upper quadrant tenderness.  SKIN:  Visible blood vessels noted on nose, cheeks and abdomen, as well  as left upper inner arm, mild jaundice is noted and multiple bruises.  NEURO:  Right third cranial nerve palsy at baseline.  Speech is clear,  moving all extremities.  BREASTS:  No palpable masses.  LYMPH:  No axillary or inguinal adenopathy palpated.    ASSESSMENT AND PLAN:  1. Jaundice/weight gain, suspect steatohepatitis versus alcohol      related.  Acute viral panel was negative in the office yesterday.      The patient notes weight gain, jaundice and ascites.  We we will      check an ANA.  We will also check a CT of the abdomen and pelvis      with p.o. on IV contrast.  Of note, the patient does have a bile      duct stent secondary to prior leak post cholecystectomy.  We will      check an ammonia level and discontinue her Singulair secondary to      risk for hepatotoxicity.  2. Acute renal insufficiency.  Positive proteinuria noted.  Creatinine      0.62 on March 2009.  We will hold her NSAIDs, hold Benicar, hold      Lasix, give her IV hydration and plan to check a B-met in a.m.  3. Macrocytic anemia.  The patient had a borderline low B12 level in      March 2009.  We will repeat B12, folate and iron studies.  4. Severe hyponatremia/hypochloremia.  Will give gentle IV hydration      with NS.  Check C-met in a.m. and hold Lasix.  5. Metabolic acidosis, improved today.  6. History of hypertension.  BP is low, we will hold her BP      medications.  Gentle IV hydration.  Monitor.  7. History of carpal tunnel syndrome, currently stable for outpatient      follow-up.  8. Irritable bowel syndrome, stable.  9. History of kidney stones.  No GU complaints and a urinalysis was      negative for blood yesterday.  10.History of depression.  This is stable.  11.History of allergic rhinitis, stable.  Continue Nasonex and      AstraPro.  We will discontinue Singulair secondary to #1.  12.Asthma.  Continue Symbicort and albuterol.  13.History of third nerve palsy.  14.History of migraines.      Sandford Craze, NP      Corinna L. Lendell Caprice, MD  Electronically Signed    MO/MEDQ  D:  10/26/2008  T:  10/26/2008  Job:  [id number]   cc:   Molly Maduro A. [name], M.D.  Fax: [phone]

## 2010-08-14 NOTE — Assessment & Plan Note (Signed)
[facility]                         GASTROENTEROLOGY OFFICE NOTE   NAME:[name] [name], [name] [name]                     MRN:          [id number]  DATE:06/16/2007                            DOB:          [birth year]    ADDENDUM:  Please send a copy of today's note, including all the  addendums, to Dr. Charlsie Merles.  He is with [employer] in [place].     Iva Boop, MD,FACG     CEG/MedQ  DD: 06/16/2007  DT: 06/16/2007  Job #: [id number]

## 2010-08-17 NOTE — Discharge Summary (Signed)
[name] [name], [name] [name]              ACCOUNT NO.:  [id number]   MEDICAL RECORD NO.:  [id number]          PATIENT TYPE:  INP   LOCATION:  0478                         [facility]AC[facility]   PHYSICIAN:  Currie Paris, M.D.DATE OF BIRTH:  April 27, 1970   DATE OF ADMISSION:  05/07/2004  DATE OF DISCHARGE:                                 DISCHARGE SUMMARY   FINAL DIAGNOSIS:  Bile leak status post laparoscopic cholecystectomy.   CLINICAL HISTORY:  [name] is a generally healthy 40 year old lady who  underwent cholecystectomy on approximately May 04, 2004.  This was a  fairly routine cholecystectomy and she went home the next day but then  developed some right upper quadrant pain and on February 6, was noted to  continue to have some right upper quadrant pain, and seemed to be a little  bit more than average post lap chole.  At that point laboratory studies and  a HIDA scan was obtained.  She had slight elevation in her liver functions  and a HIDA scan showed a bile leak apparently lateral from the gallbladder  fossa.   HOSPITAL COURSE:  The patient was admitted with those findings.  She had IV  fluids and antibiotics begun.  She was then seen in consultation by the GI  service.  She underwent ERCP and placement of a stent the day of admission.  The ERCP showed an apparent leak high up on the gallbladder bed consistent  with a leak from a duct of Luschka.   Following this, she did have improvement in her pain but continued for the  next couple of days to have mild nausea and pain.  Her liver functions  improved.  She remained afebrile.  Because of her ongoing symptoms, a CT  scan of the abdomen was obtained on February 9.  This showed no evidence of  an ongoing leak.  There was a small collection of fluid in the bed of the  gallbladder with a small dot of air in it consistent most likely with a  small residual fluid.  There was not any fluid tracking down along the  pericolic gutter to  suggest ongoing bile leak and everything else appeared  normal with no evidence of any obstruction.  The patient was also started on  some Reglan IV and on February 10 was feeling better although still having  some sharp right upper quadrant pain from time to time.  Her nausea was  doing better, she was able to tolerate more diet, and she felt able to be  discharged.   The patient was discharged on:  1.  Cipro 500 b.i.d.  2.  Reglan 10 30 minutes a.c. and h.s.  3.  She is to resume her usual home medications.   She is to follow up with me in the office next week.  She is also supposed  to see Dr. Leone Payor on May 29, 2004 for follow-up.    CJS/MEDQ  D:  05/11/2004  T:  05/11/2004  Job:  [id number]   cc:   Iva Boop, M.D. [facility]

## 2010-08-17 NOTE — Consult Note (Signed)
[name] [name], [name] [name]              ACCOUNT NO.:  [id number]   MEDICAL RECORD NO.:  [id number]          PATIENT TYPE:  INP   LOCATION:  0478                         FACILITY:  [facility]   PHYSICIAN:  Iva Boop, M.D. LHCDATE OF BIRTH:  [birth year]   DATE OF CONSULTATION:  05/07/2004  DATE OF DISCHARGE:                                   CONSULTATION   CHIEF COMPLAINT:  Bile leak.   HISTORY:  This is a pleasant 40 year old white female who had a laparoscopic  cholecystectomy approximately three days ago, on February 3.  Did well  initially.  Developed abdominal pain over the weekend, having some nausea  and anorexia, but no fevers, although she has felt hot at times and cold,  but her temperature was a little low.  Abdominal pain is mainly right-sided  and severe, coming in waves at times.  HIDA scan showed a bile leak, and she  was admitted today.  The gallbladder fossa was deeper than normal, according  to Dr. Tenna Child notes.  The intraoperative cholangiogram was negative for  stones.  Preoperative ultrasound was notable for a single gallstone, which  was apparently palpated during the surgery.  Laboratory testing from  February 3 demonstrated a negative pregnancy test, normal amylase.  UA  showed trace blood, 15 ketones, 30 mg/dl protein.  Microscopic was negative.  CMET showed alk phos low at 19 and an AST of 45.  Her CBC had a normal white  count, hemoglobin and hematocrit.   DRUG ALLERGIES:  AMOXICILLIN.  She is intolerant to VICODIN due to  constipation.   PAST MEDICAL HISTORY:  Alternating irritable bowel syndrome.  She has  previously been evaluated by Dr. Lina Sar.  She had a negative EGD in  January.  Additional medical history includes anxiety.   HOME MEDICATIONS:  1.  Seasonal oral contraceptive q.d.  2.  Zyrtec-D b.i.d.  3.  Effexor XR 75 mg q.d.  4.  Trazodone 100 mg q.h.s.  5.  Percocet p.r.n.  6.  Ibuprofen p.r.n.   PAST SURGICAL HISTORY:  Left knee  arthroscopy.  Right eye muscle surgery  secondary to III nerve palsy in the past.   SOCIAL HISTORY:  She lives with her husband.  She is an Research scientist (medical).  No tobacco.   FAMILY HISTORY:  Bleeding peptic ulcer disease in her father.  A  cholecystectomy in a sister.  Another sister had a pituitary resection.   REVIEW OF SYSTEMS:  No obvious jaundice.  Occasional insomnia, anxiety.  Her  pain is somewhat pleuritic.  Generally healthy.   REVIEW OF SYSTEMS:  All other systems appear negative.   PHYSICAL EXAMINATION:  VITAL SIGNS:  Temperature 98.1, blood pressure  106/69, respirations 16, pulse 117.  GENERAL:  A pleasant, mildly ill-appearing white female in no acute  distress.  HEENT:  Eyes anicteric.  ENT moist, clear.  Normal-appearing mouth.  NECK:  Supple.  No mass.  CHEST:  Clear.  HEART:  S1 and S2.  No murmurs, rubs or gallops.  ABDOMEN:  Soft.  Bowel sounds are present.  She is very tender diffusely,  worse on the right.  Laparoscopic incisions are present.  They look  appropriate for time postoperatively.  EXTREMITIES:  No edema.  NEUROLOGIC:  She is alert and oriented x 3.  SKIN:  There is a tattoo in the left posterior shoulder.  No rash.  LYMPH NODES:  No neck or supraclavicular nodes.   LABS:  As above.  None new today.   ASSESSMENT:  Bile leak after laparoscopic cholecystectomy.  The patient is  quite symptomatic.  Other problems as mentioned above in the past medical  history.   RECOMMENDATIONS/PLANS:  She needs an ERCP with biliary stent placement to  try to help this heal.  I have explained the risks, benefits and indications  of that.  She has been ordered to have Cipro, and this is an appropriate  antibiotic which should be continued for her bile leak and bile peritonitis.  The appropriate pain and antiemetic control have been ordered with Dilaudid  and Phenergan.  Would continue those.   I appreciate the opportunity to care for this patient.       CEG/MEDQ  D:  05/07/2004  T:  05/07/2004  Job:  [id number]   cc:   Currie Paris, M.D.  [address], [address]  [place]  [place] [address]   Lina Sar, M.D. [facility]   Dineen Kid. Rana Snare, M.D.  [address]  [place]  [place] [address]  Fax: [phone]

## 2010-08-17 NOTE — Op Note (Signed)
NAMEMARGA, [name] [name]              ACCOUNT NO.:  [id number]   MEDICAL RECORD NO.:  [id number]          PATIENT TYPE:  OBV   LOCATION:  0098                         FACILITY:  [facility]   PHYSICIAN:  Currie Paris, M.D.DATE OF BIRTH:  16-Jun-1970   DATE OF PROCEDURE:  05/04/2004  DATE OF DISCHARGE:                                 OPERATIVE REPORT   CCS#:  [id number]   PREOPERATIVE DIAGNOSES:  Chronic calculous cholecystitis.   POSTOPERATIVE DIAGNOSES:  Chronic calculous cholecystitis.   OPERATION:  Laparoscopic cholecystectomy with operative cholangiogram.   SURGEON:  Currie Paris, M.D.   ASSISTANT:  Lebron Conners, M.D.   ANESTHESIA:  General endotracheal.   CLINICAL HISTORY:  This lady is a 40 year old lady whose been having  episodes of what sounds like biliary colic, but more recently has had some  more significant symptoms as well as some now chronic nausea and abdominal  discomfort. Workup showing a large stone in the gallbladder and a negative  endoscopy. After discussion at length with the patient,  she elected to  proceed to cholecystectomy.   DESCRIPTION OF PROCEDURE:  The patient was seen in the holding area and had  no further questions.  She was taken to the operating room and after  satisfactory general endotracheal anesthesia had obtained, the abdomen was  prepped and draped. Time-out occurred.   The umbilical incision was infiltrated with some 0.25% plain Marcaine and  skin incision made, the fascia identified and opened and the peritoneal  cavity entered under direct vision. A pursestring was placed, the Hasson  introduced and the abdomen insufflated to 15. The patient was placed in  reverse Trendelenburg and tilted to the left. Under direct vision, a 10/11  trocar was placed in the epigastrium and two 5 mm laterally using Marcaine  for each incision.   The gallbladder had omentum attached to the entire length which was taken  down with cautery and  scissors. The gallbladder was almost hidden in a fossa  a little bit more than normally seen,  but otherwise was unremarkable. There  is no evidence of any other gross abnormalities of the peritoneal cavity.   With some retraction on the fundus the gallbladder and the gallbladder  retracted over the liver, the peritoneum over the cystic duct was opened and  I could identify the cystic duct and the cystic artery and dissected them  out for some length and made a window behind it so I could see a window  between the cystic duct and cystic artery and another window between the  cystic artery and the bed of the gallbladder having freed up the gallbladder  somewhat above this.   A single clip was placed on the cystic duct and one at the junction of the  gallbladder and one on the cystic artery. The cystic duct was opened and a  Cook catheter placed percutaneously and threaded into the cystic duct and  held with a clip. Operative cholangiography showed good filling of the  duodenum, normal hepatic radicals and no filling defects.   The cystic catheter was removed and three clips  placed on the stay wide side  of the cystic duct. It was divided. Two additional clips were placed in the  cystic artery  and it was divided. The gallbladder was removed from below to  above with coagulation current of the cautery. A posterior artery was  identified and clipped. Once the gallbladder was disconnected, we irrigated  and made sure it was dry and then put it in a bag and brought it out the  umbilical port. It appeared to contain a single large stone as indicated by  preoperative ultrasound.   Once the gallbladder was removed, we reinsufflated, irrigated and made sure  everything stayed dry and then removed the lateral ports. The umbilical site  was closed with the pursestring. The abdomen was deflated through the  epigastric port. The skin was closed with 4-0 Monocryl subcuticular and  Dermabond.   The  patient tolerated the procedure well. There were no operative  complications. All counts were correct.      CJS/MEDQ  D:  05/04/2004  T:  05/04/2004  Job:  [id number]   cc:   Lina Sar, M.D. [facility]

## 2010-12-20 LAB — HEPATIC FUNCTION PANEL
ALT: 38 — ABNORMAL HIGH
AST: 70 — ABNORMAL HIGH
Bilirubin, Direct: 0.2
Indirect Bilirubin: 0.6
Total Bilirubin: 0.8

## 2010-12-20 LAB — URINALYSIS, ROUTINE W REFLEX MICROSCOPIC
Glucose, UA: NEGATIVE
Leukocytes, UA: NEGATIVE
Nitrite: NEGATIVE
Specific Gravity, Urine: 1.041 — ABNORMAL HIGH
pH: 7

## 2010-12-20 LAB — BASIC METABOLIC PANEL
CO2: 22
Calcium: 8.7
GFR calc Af Amer: >60
Glucose, Bld: 90
Potassium: 4
Sodium: 136

## 2010-12-20 LAB — URINE CULTURE: Colony Count: 15000

## 2010-12-20 LAB — CBC
HCT: 38.1
Hemoglobin: 13.5
MCHC: 35.6
RBC: 4.02

## 2010-12-20 LAB — DIFFERENTIAL
Basophils Relative: 0
Eosinophils Relative: 1
Lymphocytes Relative: 42
Monocytes Absolute: 0.1
Monocytes Relative: 4
Neutro Abs: 1.9

## 2010-12-20 LAB — URINE MICROSCOPIC-ADD ON

## 2010-12-24 LAB — COMPREHENSIVE METABOLIC PANEL
AST: 76 — ABNORMAL HIGH
Albumin: 3.8
Alkaline Phosphatase: 39
CO2: 25
Chloride: 102
Creatinine, Ser: 0.62
GFR calc Af Amer: >60
GFR calc non Af Amer: >60
Potassium: 3.7
Total Bilirubin: 0.7

## 2010-12-24 LAB — URINALYSIS, ROUTINE W REFLEX MICROSCOPIC
Glucose, UA: NEGATIVE
Ketones, ur: NEGATIVE
Protein, ur: NEGATIVE
pH: 7

## 2010-12-24 LAB — CBC
HCT: 37.4
MCV: 96.8
Platelets: 208
RBC: 3.86 — ABNORMAL LOW
WBC: 4.2

## 2010-12-24 LAB — DIFFERENTIAL
Basophils Absolute: 0
Basophils Relative: 0
Eosinophils Absolute: 0
Eosinophils Relative: 0
Monocytes Absolute: 0.3

## 2010-12-24 LAB — POCT CARDIAC MARKERS: Troponin i, poc: <0.05

## 2010-12-24 LAB — MAGNESIUM: Magnesium: 2

## 2010-12-24 LAB — URINE MICROSCOPIC-ADD ON

## 2011-10-16 ENCOUNTER — Telehealth: Payer: Self-pay | Admitting: Internal Medicine

## 2011-10-16 NOTE — Telephone Encounter (Signed)
I spoke with Fabio Neighbors and pt has been scheduled for 11/06/11 @ 10:30am. Pt to arrive ar 10:15am. Jerene Dilling will let Dr. [name] know when appt is scheduled and if this is not soon enough, she will have Dr. [name] call to speak with one of our physicians here in the office.

## 2011-11-06 ENCOUNTER — Institutional Professional Consult (permissible substitution): Payer: Self-pay | Admitting: Internal Medicine

## 2011-11-18 ENCOUNTER — Encounter: Payer: Self-pay | Admitting: Internal Medicine

## 2011-11-18 ENCOUNTER — Ambulatory Visit (INDEPENDENT_AMBULATORY_CARE_PROVIDER_SITE_OTHER): Payer: BC Managed Care – PPO | Admitting: Internal Medicine

## 2011-11-18 VITALS — BP 130/84 | HR 128 | Temp 98.8°F | Ht 64.0 in | Wt 179.0 lb

## 2011-11-18 DIAGNOSIS — R05 Cough: Secondary | ICD-10-CM

## 2011-11-18 DIAGNOSIS — I10 Essential (primary) hypertension: Secondary | ICD-10-CM | POA: Insufficient documentation

## 2011-11-18 MED ORDER — FAMOTIDINE 20 MG PO TABS: ORAL_TABLET | ORAL | Status: DC

## 2011-11-18 MED ORDER — VALSARTAN 160 MG PO TABS: 160.0000 mg | ORAL_TABLET | Freq: Every day | ORAL | Status: DC

## 2011-11-18 NOTE — Assessment & Plan Note (Signed)
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   These are the same pts (now being labeled as having "irritable larynx syndrome" by some cough centers) who not infrequently have a history of having failed to tolerate ace inhibitors,  dry powder inhalers or biphosphonates or report having atypical reflux symptoms that don't respond to standard doses of PPI , and are easily confused as having aecopd or asthma flares by even experienced allergists/ pulmonologists.  Before further w/u therefore rec she rx gerd consistently and try off ACEI then return in one month to regroup re longterm pulmonary needs

## 2011-11-18 NOTE — Assessment & Plan Note (Signed)
ACE inhibitors are problematic in  pts with airway complaints because  even experienced pulmonologists can't always distinguish ace effects from copd/asthma/pnds/ allergies etc.  By themselves they don't actually cause a problem, much like oxygen can't by itself start a fire, but they certainly serve as a powerful catalyst or enhancer for any "fire"  or inflammatory process in the upper airway, be it caused by an ET  tube or more commonly reflux (especially in the obese or pts with known GERD or who are on biphoshonates) or URI's, due to interference with bradykinin clearance.  The effects of acei on bradykinin levels occurs in 100% of pt's on acei (unless they surreptitiously stop the med!) but the classic cough is only reported in 5%.  This leaves 95% of pts on acei's  with a variety of syndromes including no identifiable symptom in most  vs non-specific symptoms that wax and wane depending on what other insult is occuring at the level of the upper airway (like GERD in her case).    For now therefore rec trial of valsartan and off acei.

## 2011-11-18 NOTE — Progress Notes (Signed)
  Subjective:    Patient ID: [name] [name], female    DOB: [birth year]   MRN: [id number]  HPI  88 yowf never smoker with lifelong intermittent difficulty with breathing and seen by [facility] allergist Pos to dust with mostly nasal symptoms assoc with "bronchitis" referred by Dr [name] 11/18/2011 to pulmonary clinic with refractory pattern of breathlessness and cough.  11/18/2011 1st pulmonary eval on ACEI cc intermittent cough/sob with last remission spring 2013 still maintained on nasal sprays and needing ventolin once every couple of weeks at baseline  but since May with watery eyes, stuffy nose and "sense of breathing in particles"  and restarted symbicort early June and some better but still needing ventolin every few days and avoiding "all the usual triggers" and yet still daily symptoms.  No purulent sputum, sense of pnds and sorethroat lingers.  Sleeping ok without nocturnal  or early am exacerbation  of respiratory  c/o's or need for noct saba. Also denies any obvious fluctuation of symptoms with weather or environmental changes or other aggravating or alleviating factors except as outlined above   Review of Systems  Constitutional: Negative for fever, chills and unexpected weight change.  HENT: Positive for congestion and sore throat. Negative for ear pain, nosebleeds, rhinorrhea, sneezing, trouble swallowing, dental problem, voice change, postnasal drip and sinus pressure.   Eyes: Negative for visual disturbance.  Respiratory: Positive for cough and shortness of breath. Negative for choking.   Cardiovascular: Negative for chest pain and leg swelling.  Gastrointestinal: Negative for vomiting, abdominal pain and diarrhea.  Genitourinary: Negative for difficulty urinating.  Musculoskeletal: Positive for arthralgias.  Skin: Negative for rash.  Neurological: Negative for tremors, syncope and headaches.  Hematological: Does not bruise/bleed easily.       Objective:   Physical Exam Anxious  amb wf nad HEENT: nl dentition, turbinates, and orophanx. Nl external ear canals without cough reflex   NECK :  without JVD/Nodes/TM/ nl carotid upstrokes bilaterally   LUNGS: no acc muscle use, clear to A and P bilaterally without cough on insp or exp maneuvers   CV:  RRR  no s3 or murmur or increase in P2, no edema   ABD:  soft and nontender with nl excursion in the supine position. No bruits or organomegaly, bowel sounds nl  MS:  warm without deformities, calf tenderness, cyanosis or clubbing  SKIN: warm and dry without lesions    NEURO:  alert, approp, no deficits         Assessment & Plan:

## 2011-11-18 NOTE — Patient Instructions (Addendum)
Try Protonix  40 mg Take 30-60 min before first meal of the day and also  Pepcid 20 mg one bedtime until cough is completely gone  GERD (REFLUX)  is an extremely common cause of respiratory symptoms, many times with no significant heartburn at all.    It can be treated with medication, but also with lifestyle changes including avoidance of late meals, excessive alcohol, smoking cessation, and avoid fatty foods, chocolate, peppermint, colas, red wine, and acidic juices such as orange juice.  NO MINT OR MENTHOL PRODUCTS SO NO COUGH DROPS  USE SUGARLESS CANDY INSTEAD (jolley ranchers or Stover's)  NO OIL BASED VITAMINS - use powdered substitutes.   Stop prinivil/ lisinopril and start diovan(valsartan) 160 mg one daily in its place  Please schedule a follow up office visit in 4 weeks, sooner if needed

## 2011-12-09 ENCOUNTER — Encounter (HOSPITAL_COMMUNITY): Payer: Self-pay | Admitting: *Deleted

## 2011-12-09 DIAGNOSIS — S301XXA Contusion of abdominal wall, initial encounter: Secondary | ICD-10-CM | POA: Insufficient documentation

## 2011-12-09 DIAGNOSIS — Z79899 Other long term (current) drug therapy: Secondary | ICD-10-CM | POA: Insufficient documentation

## 2011-12-09 DIAGNOSIS — R079 Chest pain, unspecified: Secondary | ICD-10-CM | POA: Insufficient documentation

## 2011-12-09 DIAGNOSIS — J45909 Unspecified asthma, uncomplicated: Secondary | ICD-10-CM | POA: Insufficient documentation

## 2011-12-09 DIAGNOSIS — R1032 Left lower quadrant pain: Secondary | ICD-10-CM | POA: Insufficient documentation

## 2011-12-09 DIAGNOSIS — I10 Essential (primary) hypertension: Secondary | ICD-10-CM | POA: Insufficient documentation

## 2011-12-09 LAB — CBC WITH DIFFERENTIAL/PLATELET
Basophils Relative: 1 % (ref 0–1)
Hemoglobin: 13 g/dL (ref 12.0–15.0)
Lymphocytes Relative: 25 % (ref 12–46)
MCHC: 33.6 g/dL (ref 30.0–36.0)
Monocytes Relative: 5 % (ref 3–12)
Neutro Abs: 5.9 10*3/uL (ref 1.7–7.7)
Neutrophils Relative %: 68 % (ref 43–77)
RBC: 3.75 MIL/uL — ABNORMAL LOW (ref 3.87–5.11)
WBC: 8.7 10*3/uL (ref 4.0–10.5)

## 2011-12-09 LAB — COMPREHENSIVE METABOLIC PANEL
AST: 61 U/L — ABNORMAL HIGH (ref 0–37)
Albumin: 4.2 g/dL (ref 3.5–5.2)
Alkaline Phosphatase: 82 U/L (ref 39–117)
BUN: 14 mg/dL (ref 6–23)
CO2: 23 mEq/L (ref 19–32)
Chloride: 103 mEq/L (ref 96–112)
Potassium: 3.7 mEq/L (ref 3.5–5.1)
Total Bilirubin: 0.4 mg/dL (ref 0.3–1.2)

## 2011-12-09 LAB — URINALYSIS, ROUTINE W REFLEX MICROSCOPIC
Glucose, UA: NEGATIVE mg/dL
Ketones, ur: NEGATIVE mg/dL
Nitrite: NEGATIVE
Specific Gravity, Urine: 1.008 (ref 1.005–1.030)
pH: 6 (ref 5.0–8.0)

## 2011-12-09 NOTE — ED Notes (Signed)
Advised of the wait 
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
## 2011-12-10 ENCOUNTER — Emergency Department (HOSPITAL_COMMUNITY)
Admission: EM | Admit: 2011-12-10 | Discharge: 2011-12-10 | Disposition: A | Discharge status: Home / Self Care | Payer: BC Managed Care – PPO | Attending: Emergency Medicine | Admitting: Emergency Medicine

## 2011-12-10 ENCOUNTER — Emergency Department (HOSPITAL_COMMUNITY): Payer: BC Managed Care – PPO

## 2011-12-10 DIAGNOSIS — S301XXA Contusion of abdominal wall, initial encounter: Secondary | ICD-10-CM

## 2011-12-10 DIAGNOSIS — T148XXA Other injury of unspecified body region, initial encounter: Secondary | ICD-10-CM

## 2011-12-10 MED ORDER — HYDROMORPHONE HCL PF 1 MG/ML IJ SOLN
1.0000 mg | Freq: Once | INTRAMUSCULAR | Status: AC
  Administered 2011-12-10: 1 mg via INTRAVENOUS
  Filled 2011-12-10: qty 1

## 2011-12-10 MED ORDER — IOHEXOL 300 MG/ML  SOLN
100.0000 mL | Freq: Once | INTRAMUSCULAR | Status: AC | PRN
  Administered 2011-12-10: 100 mL via INTRAVENOUS

## 2011-12-10 MED ORDER — OXYCODONE-ACETAMINOPHEN 5-325 MG PO TABS: 2.0000 | ORAL_TABLET | ORAL | Status: AC | PRN

## 2011-12-10 MED ORDER — ONDANSETRON HCL 4 MG/2ML IJ SOLN
4.0000 mg | Freq: Once | INTRAMUSCULAR | Status: AC
  Administered 2011-12-10: 4 mg via INTRAVENOUS
  Filled 2011-12-10: qty 2

## 2011-12-10 MED ORDER — SODIUM CHLORIDE 0.9 % IV SOLN
INTRAVENOUS | Status: DC
  Administered 2011-12-10: 04:00:00 via INTRAVENOUS

## 2011-12-10 NOTE — Progress Notes (Signed)
Orthopedic Tech Progress Note Patient Details:  [name] [name] 09-30-1970 [id number]      [name] 12/10/2011, 5:59 AM I put a sling on right arm

## 2011-12-10 NOTE — ED Notes (Signed)
Ortho called for sling 

## 2011-12-10 NOTE — ED Notes (Signed)
Attempted IV x2. Another RN to assess pt for IV stick
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
## 2011-12-17 ENCOUNTER — Ambulatory Visit: Payer: BC Managed Care – PPO | Admitting: Internal Medicine

## 2011-12-24 ENCOUNTER — Ambulatory Visit (INDEPENDENT_AMBULATORY_CARE_PROVIDER_SITE_OTHER): Payer: BC Managed Care – PPO | Admitting: Internal Medicine

## 2011-12-24 ENCOUNTER — Encounter: Payer: Self-pay | Admitting: Internal Medicine

## 2011-12-24 VITALS — BP 118/78 | HR 90 | Temp 98.2°F | Ht 64.0 in | Wt 180.0 lb

## 2011-12-24 DIAGNOSIS — J45909 Unspecified asthma, uncomplicated: Secondary | ICD-10-CM

## 2011-12-24 DIAGNOSIS — I10 Essential (primary) hypertension: Secondary | ICD-10-CM

## 2011-12-24 DIAGNOSIS — R05 Cough: Secondary | ICD-10-CM

## 2011-12-24 DIAGNOSIS — Z23 Encounter for immunization: Secondary | ICD-10-CM

## 2011-12-24 NOTE — Assessment & Plan Note (Signed)
At this point not entirely clear she has it but does have a long hx of what sounds like either asthma or pseudoasthma related to an irritable larynx so hfa best bet.  Once these symptoms are completely eliminated ok with me to change to prn symbicort to see if any of her problems flare ( a reverse therapeutic trial)   Pulmonary f/u however can be prn

## 2011-12-24 NOTE — Patient Instructions (Addendum)
Try protonix 40 mg  Take 30-60 min before first meal of the day and Pepcid 20 mg one bedtime until cough is completely gone for at least a week without the need for cough suppression or extra doses ventolin   Chlortrimeton (otc)  4 mg one at bedtime and as needed during the day is the best choice for allergy drainage.   GERD (REFLUX)  is an extremely common cause of respiratory symptoms, many times with no significant heartburn at all.    It can be treated with medication, but also with lifestyle changes including avoidance of late meals, excessive alcohol, smoking cessation, and avoid fatty foods, chocolate, peppermint, colas, red wine, and acidic juices such as orange juice.  NO MINT OR MENTHOL PRODUCTS SO NO COUGH DROPS  USE SUGARLESS CANDY INSTEAD (jolley ranchers or Stover's)  NO OIL BASED VITAMINS - use powdered substitutes.    Work on inhaler technique:  relax and gently blow all the way out then take a nice smooth deep breath back in, triggering the inhaler at same time you start breathing in.  Hold for up to 5 seconds if you can and let it back out through nose.  Rinse and gargle with water when done.   If your mouth or throat starts to bother you,   I suggest you time the inhaler to your dental care and after using the inhaler(s) brush teeth and tongue with a baking soda containing toothpaste and when you rinse this out, gargle with it first to see if this helps your mouth and throat.     If you are satisfied with your treatment plan let your doctor know and he/she can either refill your medications or you can return here when your prescription runs out.     If in any way you are not 100% satisfied,  please tell us.  If 100% better, tell your friends!

## 2011-12-24 NOTE — Progress Notes (Signed)
  Subjective:    Patient ID: [name] [name], female    DOB: 1970-10-07   MRN: [id number]  HPI  49 yowf never smoker with lifelong intermittent difficulty with breathing and seen by [facility] allergist [place] with mostly nasal symptoms assoc with "bronchitis" referred by Dr [name] 11/18/2011 to pulmonary clinic with refractory pattern of breathlessness and cough.  11/18/2011 1st pulmonary eval on ACEI cc intermittent cough/sob with last remission spring 2013 still maintained on nasal sprays and needing ventolin once every couple of weeks at baseline (since 2004)  but since May with watery eyes, stuffy nose and "sense of breathing in particles"  and restarted symbicort early June and some better but still needing ventolin every few days and avoiding "all the usual triggers" and yet still daily symptoms.  No purulent sputum, sense of pnds and sorethroat lingers. rec Try Protonix  40 mg Take 30-60 min before first meal of the day and also  Pepcid 20 mg one bedtime until cough is completely gone GERD    Stop prinivil/ lisinopril and start diovan(valsartan) 160 mg one daily in its place        12/24/2011 f/u ov/Mel Langan cc much better on symbicort 160 2 bid and last ventolin > one week prior to OV   overall 90% better with no identifiable limiting activities causing sob. No obvious daytime variabilty or assoc   cp or chest tightness, subjective wheeze overt sinus or hb symptoms. No unusual exp hx.  Sleeping ok without nocturnal  or early am exacerbation  of respiratory  c/o's or need for noct saba. Also denies any obvious fluctuation of symptoms with weather or environmental changes or other aggravating or alleviating factors except as outlined above         Objective:   Physical Exam Anxious amb wf nad HEENT: nl dentition, turbinates, and orophanx. Nl external ear canals without cough reflex   NECK :  without JVD/Nodes/TM/ nl carotid upstrokes bilaterally   LUNGS: no acc muscle use, clear to  A and P bilaterally without cough on insp or exp maneuvers   CV:  RRR  no s3 or murmur or increase in P2, no edema   ABD:  soft and nontender with nl excursion in the supine position. No bruits or organomegaly, bowel sounds nl  MS:  warm without deformities, calf tenderness, cyanosis or clubbing     12/10/11 cxr No evidence of active pulmonary disease.      Assessment & Plan:

## 2011-12-24 NOTE — Assessment & Plan Note (Addendum)
Clearly better off ACEI > this is most c/w  Classic Upper airway cough syndrome, so named because it's frequently impossible to sort out how much is  CR/sinusitis with freq throat clearing (which can be related to primary GERD)   vs  causing  secondary (" extra esophageal")  GERD from wide swings in gastric pressure that occur with throat clearing, often  promoting self use of mint and menthol lozenges that reduce the lower esophageal sphincter tone and exacerbate the problem further in a cyclical fashion.   These are the same pts (now being labeled as having "irritable larynx syndrome" by some cough centers) who not infrequently have a history of having failed to tolerate ace inhibitors,  dry powder inhalers or biphosphonates or report having atypical reflux symptoms that don't respond to standard doses of PPI , and are easily confused as having aecopd or asthma flares by even experienced allergists/ pulmonologists.   For now continue off acei, max gerd rx, and 1st get h1 per guidelines.  Pulmonary f/u can be prn

## 2011-12-24 NOTE — Assessment & Plan Note (Signed)
Changed acei to ARB 11/18/2011  Due cough > resolved  ACE inhibitors are problematic in  pts with airway complaints because  even experienced pulmonologists can't always distinguish ace effects from copd/asthma/pnds/ allergies etc.  By themselves they don't actually cause a problem, much like oxygen can't by itself start a fire, but they certainly serve as a powerful catalyst or enhancer for any "fire"  or inflammatory process in the upper airway, be it caused by an ET  tube or more commonly reflux (especially in the obese or pts with known GERD or who are on biphoshonates) or URI's, due to interference with bradykinin clearance.  The effects of acei on bradykinin levels occurs in 100% of pt's on acei (unless they surreptitiously stop the med!) but the classic cough is only reported in 5%.  This leaves 95% of pts on acei's  with a variety of syndromes including no identifiable symptom in most  vs non-specific symptoms that wax and wane depending on what other insult is occuring at the level of the upper airway like pnds and gerd. So I would avoid further acei exposure here

## 2012-01-03 ENCOUNTER — Ambulatory Visit: Payer: BC Managed Care – PPO | Admitting: Internal Medicine

## 2012-05-01 ENCOUNTER — Telehealth: Payer: Self-pay | Admitting: Internal Medicine

## 2012-05-01 NOTE — Telephone Encounter (Signed)
Member ID # [id number] Called [employer] for PA at [phone] PA form for Diovan received and placed in MW look at. MW, pls sign form and give back to [place]. Thanks!

## 2012-05-06 NOTE — Telephone Encounter (Signed)
PA form signed by MW and has been faxed back to [facility]. Wiil hold in Leslie's box for response.

## 2012-05-11 NOTE — Telephone Encounter (Signed)
Losartan 100 mg will do , take one daily

## 2012-05-11 NOTE — Telephone Encounter (Signed)
Diovan HCT has been DENIED.  Alternatives are:  Losartan, Losartan HCT, Irbesartan, Irbesartan HCT, Eprotosartan and Eprotosartan HCT. MW, pls advise. Allergies  Allergen Reactions  . Amoxicillin Anhydrous (Amoxicillin) Hives  . Ciprofloxacin Hives

## 2012-05-11 NOTE — Telephone Encounter (Signed)
LMTCB for the pt 

## 2012-05-13 NOTE — Telephone Encounter (Signed)
LMTCB

## 2012-05-19 NOTE — Telephone Encounter (Signed)
Pt states Dr. [name] has already changed the Diovan to Losartan 100 mg once a day and sent the new RX. Pt's chart has been updated to reflect this change and nothing further is needed.

## 2012-11-16 ENCOUNTER — Other Ambulatory Visit: Payer: Self-pay | Admitting: Internal Medicine

## 2013-02-04 ENCOUNTER — Other Ambulatory Visit: Payer: Self-pay

## 2013-05-01 ENCOUNTER — Other Ambulatory Visit: Payer: Self-pay | Admitting: Internal Medicine

## 2013-12-16 ENCOUNTER — Encounter: Payer: Self-pay | Admitting: Internal Medicine

## 2014-07-20 ENCOUNTER — Other Ambulatory Visit: Payer: Self-pay | Admitting: Obstetrics and Gynecology

## 2014-07-22 LAB — CYTOLOGY - PAP

## 2014-12-21 ENCOUNTER — Inpatient Hospital Stay (HOSPITAL_COMMUNITY): Payer: BLUE CROSS/BLUE SHIELD

## 2014-12-21 ENCOUNTER — Emergency Department (HOSPITAL_COMMUNITY): Payer: BLUE CROSS/BLUE SHIELD

## 2014-12-21 ENCOUNTER — Inpatient Hospital Stay (HOSPITAL_COMMUNITY)
Admission: EM | Admit: 2014-12-21 | Discharge: 2014-12-31 | DRG: 871 | Disposition: E | Payer: BLUE CROSS/BLUE SHIELD | Attending: Pulmonary Disease | Admitting: Pulmonary Disease

## 2014-12-21 ENCOUNTER — Encounter (HOSPITAL_COMMUNITY): Payer: Self-pay

## 2014-12-21 DIAGNOSIS — Z6826 Body mass index (BMI) 26.0-26.9, adult: Secondary | ICD-10-CM

## 2014-12-21 DIAGNOSIS — G9341 Metabolic encephalopathy: Secondary | ICD-10-CM | POA: Diagnosis present

## 2014-12-21 DIAGNOSIS — D689 Coagulation defect, unspecified: Secondary | ICD-10-CM | POA: Diagnosis present

## 2014-12-21 DIAGNOSIS — K7031 Alcoholic cirrhosis of liver with ascites: Secondary | ICD-10-CM

## 2014-12-21 DIAGNOSIS — Z452 Encounter for adjustment and management of vascular access device: Secondary | ICD-10-CM | POA: Diagnosis not present

## 2014-12-21 DIAGNOSIS — E876 Hypokalemia: Secondary | ICD-10-CM | POA: Diagnosis present

## 2014-12-21 DIAGNOSIS — E871 Hypo-osmolality and hyponatremia: Secondary | ICD-10-CM | POA: Diagnosis present

## 2014-12-21 DIAGNOSIS — J9601 Acute respiratory failure with hypoxia: Secondary | ICD-10-CM

## 2014-12-21 DIAGNOSIS — A419 Sepsis, unspecified organism: Principal | ICD-10-CM | POA: Diagnosis present

## 2014-12-21 DIAGNOSIS — K7200 Acute and subacute hepatic failure without coma: Secondary | ICD-10-CM | POA: Diagnosis present

## 2014-12-21 DIAGNOSIS — J45909 Unspecified asthma, uncomplicated: Secondary | ICD-10-CM | POA: Diagnosis present

## 2014-12-21 DIAGNOSIS — I959 Hypotension, unspecified: Secondary | ICD-10-CM

## 2014-12-21 DIAGNOSIS — E872 Acidosis: Secondary | ICD-10-CM

## 2014-12-21 DIAGNOSIS — D539 Nutritional anemia, unspecified: Secondary | ICD-10-CM | POA: Diagnosis present

## 2014-12-21 DIAGNOSIS — E538 Deficiency of other specified B group vitamins: Secondary | ICD-10-CM | POA: Diagnosis present

## 2014-12-21 DIAGNOSIS — E039 Hypothyroidism, unspecified: Secondary | ICD-10-CM | POA: Diagnosis present

## 2014-12-21 DIAGNOSIS — I10 Essential (primary) hypertension: Secondary | ICD-10-CM | POA: Diagnosis present

## 2014-12-21 DIAGNOSIS — IMO0001 Reserved for inherently not codable concepts without codable children: Secondary | ICD-10-CM | POA: Insufficient documentation

## 2014-12-21 DIAGNOSIS — R6521 Severe sepsis with septic shock: Secondary | ICD-10-CM | POA: Diagnosis present

## 2014-12-21 DIAGNOSIS — I248 Other forms of acute ischemic heart disease: Secondary | ICD-10-CM | POA: Diagnosis present

## 2014-12-21 DIAGNOSIS — N179 Acute kidney failure, unspecified: Secondary | ICD-10-CM | POA: Diagnosis present

## 2014-12-21 DIAGNOSIS — I469 Cardiac arrest, cause unspecified: Secondary | ICD-10-CM | POA: Diagnosis present

## 2014-12-21 DIAGNOSIS — R34 Anuria and oliguria: Secondary | ICD-10-CM | POA: Diagnosis not present

## 2014-12-21 DIAGNOSIS — J9600 Acute respiratory failure, unspecified whether with hypoxia or hypercapnia: Secondary | ICD-10-CM | POA: Diagnosis present

## 2014-12-21 DIAGNOSIS — K7460 Unspecified cirrhosis of liver: Secondary | ICD-10-CM | POA: Diagnosis present

## 2014-12-21 DIAGNOSIS — E669 Obesity, unspecified: Secondary | ICD-10-CM | POA: Diagnosis present

## 2014-12-21 DIAGNOSIS — R531 Weakness: Secondary | ICD-10-CM | POA: Diagnosis present

## 2014-12-21 LAB — GLUCOSE, CAPILLARY
GLUCOSE-CAPILLARY: 232 mg/dL — AB (ref 65–99)
GLUCOSE-CAPILLARY: 256 mg/dL — AB (ref 65–99)

## 2014-12-21 LAB — RENAL FUNCTION PANEL
ANION GAP: 32 — AB (ref 5–15)
Albumin: 1.6 g/dL — ABNORMAL LOW (ref 3.5–5.0)
BUN: 32 mg/dL — ABNORMAL HIGH (ref 6–20)
CHLORIDE: 91 mmol/L — AB (ref 101–111)
CO2: 5 mmol/L — ABNORMAL LOW (ref 22–32)
Calcium: 4.9 mg/dL — CL (ref 8.9–10.3)
Creatinine, Ser: 7.89 mg/dL — ABNORMAL HIGH (ref 0.44–1.00)
GFR, EST AFRICAN AMERICAN: 6 mL/min — AB (ref >60)
GFR, EST NON AFRICAN AMERICAN: 6 mL/min — AB (ref >60)
Glucose, Bld: 319 mg/dL — ABNORMAL HIGH (ref 65–99)
PHOSPHORUS: 11.5 mg/dL — AB (ref 2.5–4.6)
POTASSIUM: 4.6 mmol/L (ref 3.5–5.1)
Sodium: 128 mmol/L — ABNORMAL LOW (ref 135–145)

## 2014-12-21 LAB — URINE MICROSCOPIC-ADD ON

## 2014-12-21 LAB — AMYLASE: Amylase: 64 U/L (ref 28–100)

## 2014-12-21 LAB — CBC
HEMATOCRIT: 25.2 % — AB (ref 36.0–46.0)
HEMOGLOBIN: 8.8 g/dL — AB (ref 12.0–15.0)
MCH: 38.6 pg — ABNORMAL HIGH (ref 26.0–34.0)
MCHC: 34.9 g/dL (ref 30.0–36.0)
MCV: 110.5 fL — AB (ref 78.0–100.0)
Platelets: 120 10*3/uL — ABNORMAL LOW (ref 150–400)
RBC: 2.28 MIL/uL — ABNORMAL LOW (ref 3.87–5.11)
RDW: 17.4 % — AB (ref 11.5–15.5)
WBC: 26.9 10*3/uL — AB (ref 4.0–10.5)

## 2014-12-21 LAB — URINALYSIS, ROUTINE W REFLEX MICROSCOPIC
GLUCOSE, UA: 500 mg/dL — AB
KETONES UR: NEGATIVE mg/dL
Leukocytes, UA: NEGATIVE
Nitrite: NEGATIVE
SPECIFIC GRAVITY, URINE: 1.025 (ref 1.005–1.030)
UROBILINOGEN UA: 0.2 mg/dL (ref 0.0–1.0)
pH: 6 (ref 5.0–8.0)

## 2014-12-21 LAB — MAGNESIUM: MAGNESIUM: 2.4 mg/dL (ref 1.7–2.4)

## 2014-12-21 LAB — I-STAT CHEM 8, ED
BUN: 42 mg/dL — AB (ref 6–20)
CALCIUM ION: 0.74 mmol/L — AB (ref 1.12–1.23)
CHLORIDE: 96 mmol/L — AB (ref 101–111)
Creatinine, Ser: 12.4 mg/dL — ABNORMAL HIGH (ref 0.44–1.00)
GLUCOSE: 69 mg/dL (ref 65–99)
HCT: 40 % (ref 36.0–46.0)
Hemoglobin: 13.6 g/dL (ref 12.0–15.0)
POTASSIUM: 3.5 mmol/L (ref 3.5–5.1)
Sodium: 127 mmol/L — ABNORMAL LOW (ref 135–145)
TCO2: 8 mmol/L (ref 0–100)

## 2014-12-21 LAB — CBG MONITORING, ED
GLUCOSE-CAPILLARY: 64 mg/dL — AB (ref 65–99)
Glucose-Capillary: 147 mg/dL — ABNORMAL HIGH (ref 65–99)

## 2014-12-21 LAB — CBC WITH DIFFERENTIAL/PLATELET
Basophils Absolute: 0 10*3/uL (ref 0.0–0.1)
Basophils Relative: 0 %
EOS ABS: 0 10*3/uL (ref 0.0–0.7)
EOS PCT: 0 %
HCT: 32.2 % — ABNORMAL LOW (ref 36.0–46.0)
Hemoglobin: 10.9 g/dL — ABNORMAL LOW (ref 12.0–15.0)
LYMPHS ABS: 1.7 10*3/uL (ref 0.7–4.0)
Lymphocytes Relative: 7 %
MCH: 36.8 pg — AB (ref 26.0–34.0)
MCHC: 33.9 g/dL (ref 30.0–36.0)
MCV: 108.8 fL — ABNORMAL HIGH (ref 78.0–100.0)
MONO ABS: 1.3 10*3/uL — AB (ref 0.1–1.0)
MONOS PCT: 5 %
Neutro Abs: 21.7 10*3/uL — ABNORMAL HIGH (ref 1.7–7.7)
Neutrophils Relative %: 88 %
PLATELETS: 156 10*3/uL (ref 150–400)
RBC: 2.96 MIL/uL — AB (ref 3.87–5.11)
RDW: 17 % — AB (ref 11.5–15.5)
WBC: 24.8 10*3/uL — AB (ref 4.0–10.5)

## 2014-12-21 LAB — PROTIME-INR
INR: 4.35 — ABNORMAL HIGH (ref 0.00–1.49)
INR: 3.07 — AB (ref 0.00–1.49)
PROTHROMBIN TIME: 40.5 s — AB (ref 11.6–15.2)
Prothrombin Time: 31.1 seconds — ABNORMAL HIGH (ref 11.6–15.2)

## 2014-12-21 LAB — POCT I-STAT 3, ART BLOOD GAS (G3+)
ACID-BASE DEFICIT: 20 mmol/L — AB (ref 0.0–2.0)
Bicarbonate: 6.9 mEq/L — ABNORMAL LOW (ref 20.0–24.0)
O2 SAT: 97 %
PH ART: 7.144 — AB (ref 7.350–7.450)
TCO2: 8 mmol/L (ref 0–100)
pCO2 arterial: 19.8 mmHg — CL (ref 35.0–45.0)
pO2, Arterial: 105 mmHg — ABNORMAL HIGH (ref 80.0–100.0)

## 2014-12-21 LAB — COMPREHENSIVE METABOLIC PANEL
ALT: 194 U/L — ABNORMAL HIGH (ref 14–54)
ANION GAP: 29 — AB (ref 5–15)
AST: 1584 U/L — ABNORMAL HIGH (ref 15–41)
Albumin: 1.7 g/dL — ABNORMAL LOW (ref 3.5–5.0)
Alkaline Phosphatase: 704 U/L — ABNORMAL HIGH (ref 38–126)
BUN: 47 mg/dL — ABNORMAL HIGH (ref 6–20)
CHLORIDE: 91 mmol/L — AB (ref 101–111)
CO2: 7 mmol/L — AB (ref 22–32)
Calcium: 6.5 mg/dL — ABNORMAL LOW (ref 8.9–10.3)
Creatinine, Ser: 12.13 mg/dL — ABNORMAL HIGH (ref 0.44–1.00)
GFR, EST AFRICAN AMERICAN: 4 mL/min — AB (ref >60)
GFR, EST NON AFRICAN AMERICAN: 3 mL/min — AB (ref >60)
Glucose, Bld: 74 mg/dL (ref 65–99)
POTASSIUM: 3.5 mmol/L (ref 3.5–5.1)
SODIUM: 127 mmol/L — AB (ref 135–145)
Total Bilirubin: 5.6 mg/dL — ABNORMAL HIGH (ref 0.3–1.2)
Total Protein: 5.2 g/dL — ABNORMAL LOW (ref 6.5–8.1)

## 2014-12-21 LAB — BASIC METABOLIC PANEL
ANION GAP: 30 — AB (ref 5–15)
Anion gap: 25 — ABNORMAL HIGH (ref 5–15)
Anion gap: 25 — ABNORMAL HIGH (ref 5–15)
BUN: 37 mg/dL — AB (ref 6–20)
BUN: 37 mg/dL — AB (ref 6–20)
BUN: 35 mg/dL — AB (ref 6–20)
CHLORIDE: 98 mmol/L — AB (ref 101–111)
CHLORIDE: 97 mmol/L — AB (ref 101–111)
CHLORIDE: 91 mmol/L — AB (ref 101–111)
CO2: 7 mmol/L — ABNORMAL LOW (ref 22–32)
CO2: 7 mmol/L — ABNORMAL LOW (ref 22–32)
CO2: 6 mmol/L — ABNORMAL LOW (ref 22–32)
Calcium: 5.2 mg/dL — CL (ref 8.9–10.3)
Calcium: 5.4 mg/dL — CL (ref 8.9–10.3)
Calcium: 4.9 mg/dL — CL (ref 8.9–10.3)
Creatinine, Ser: 9.47 mg/dL — ABNORMAL HIGH (ref 0.44–1.00)
Creatinine, Ser: 9.5 mg/dL — ABNORMAL HIGH (ref 0.44–1.00)
Creatinine, Ser: 8.7 mg/dL — ABNORMAL HIGH (ref 0.44–1.00)
GFR calc Af Amer: 5 mL/min — ABNORMAL LOW (ref >60)
GFR calc Af Amer: 5 mL/min — ABNORMAL LOW (ref >60)
GFR calc Af Amer: 6 mL/min — ABNORMAL LOW (ref >60)
GFR calc non Af Amer: 4 mL/min — ABNORMAL LOW (ref >60)
GFR calc non Af Amer: 4 mL/min — ABNORMAL LOW (ref >60)
GFR calc non Af Amer: 5 mL/min — ABNORMAL LOW (ref >60)
GLUCOSE: 176 mg/dL — AB (ref 65–99)
GLUCOSE: 210 mg/dL — AB (ref 65–99)
GLUCOSE: 336 mg/dL — AB (ref 65–99)
POTASSIUM: 3.4 mmol/L — AB (ref 3.5–5.1)
POTASSIUM: 3.3 mmol/L — AB (ref 3.5–5.1)
POTASSIUM: 4.4 mmol/L (ref 3.5–5.1)
SODIUM: 127 mmol/L — AB (ref 135–145)
Sodium: 130 mmol/L — ABNORMAL LOW (ref 135–145)
Sodium: 129 mmol/L — ABNORMAL LOW (ref 135–145)

## 2014-12-21 LAB — LACTATE DEHYDROGENASE: LDH: 2284 U/L — AB (ref 98–192)

## 2014-12-21 LAB — I-STAT CG4 LACTIC ACID, ED: Lactic Acid, Venous: 7.75 mmol/L (ref 0.5–2.0)

## 2014-12-21 LAB — I-STAT TROPONIN, ED
TROPONIN I, POC: 0.5 ng/mL — AB (ref 0.00–0.08)
Troponin i, poc: 0.41 ng/mL (ref 0.00–0.08)

## 2014-12-21 LAB — I-STAT ARTERIAL BLOOD GAS, ED
Acid-base deficit: 23 mmol/L — ABNORMAL HIGH (ref 0.0–2.0)
Bicarbonate: 7.1 mEq/L — ABNORMAL LOW (ref 20.0–24.0)
O2 SAT: 100 %
PH ART: 6.971 — AB (ref 7.350–7.450)
TCO2: 8 mmol/L (ref 0–100)
pCO2 arterial: 30.6 mmHg — ABNORMAL LOW (ref 35.0–45.0)
pO2, Arterial: 255 mmHg — ABNORMAL HIGH (ref 80.0–100.0)

## 2014-12-21 LAB — ABO/RH: ABO/RH(D): A NEG

## 2014-12-21 LAB — TSH: TSH: 1.679 u[IU]/mL (ref 0.350–4.500)

## 2014-12-21 LAB — CREATININE, URINE, RANDOM: Creatinine, Urine: 88.43 mg/dL

## 2014-12-21 LAB — ETHANOL: Alcohol, Ethyl (B): <5 mg/dL (ref <5)

## 2014-12-21 LAB — TROPONIN I
Troponin I: 0.41 ng/mL — ABNORMAL HIGH (ref <0.031)
Troponin I: 1.04 ng/mL (ref <0.031)

## 2014-12-21 LAB — LIPASE, BLOOD: LIPASE: 44 U/L (ref 22–51)

## 2014-12-21 LAB — MRSA PCR SCREENING: MRSA BY PCR: NEGATIVE

## 2014-12-21 LAB — BLOOD PRODUCT ORDER (VERBAL) VERIFICATION

## 2014-12-21 LAB — OCCULT BLOOD, POC DEVICE: FECAL OCCULT BLD: POSITIVE — AB

## 2014-12-21 LAB — LACTIC ACID, PLASMA
LACTIC ACID, VENOUS: 7.4 mmol/L — AB (ref 0.5–2.0)
LACTIC ACID, VENOUS: 12.1 mmol/L — AB (ref 0.5–2.0)
Lactic Acid, Venous: 16.9 mmol/L (ref 0.5–2.0)

## 2014-12-21 LAB — PHOSPHORUS: PHOSPHORUS: 10.1 mg/dL — AB (ref 2.5–4.6)

## 2014-12-21 LAB — SODIUM, URINE, RANDOM: SODIUM UR: 37 mmol/L

## 2014-12-21 LAB — AMMONIA: AMMONIA: 83 umol/L — AB (ref 9–35)

## 2014-12-21 LAB — CORTISOL: Cortisol, Plasma: 44.2 ug/dL

## 2014-12-21 LAB — APTT: APTT: 118 s — AB (ref 24–37)

## 2014-12-21 LAB — PROCALCITONIN: Procalcitonin: 19.66 ng/mL

## 2014-12-21 MED ORDER — SUCCINYLCHOLINE CHLORIDE 20 MG/ML IJ SOLN
INTRAMUSCULAR | Status: AC
  Filled 2014-12-21: qty 1

## 2014-12-21 MED ORDER — DEXTROSE 5 % IV SOLN
2.0000 g | Freq: Two times a day (BID) | INTRAVENOUS | Status: DC
  Administered 2014-12-21: 2 g via INTRAVENOUS
  Filled 2014-12-21 (×3): qty 2

## 2014-12-21 MED ORDER — HEPARIN SODIUM (PORCINE) 1000 UNIT/ML DIALYSIS: 1000.0000 [IU] | INTRAMUSCULAR | Status: DC | PRN

## 2014-12-21 MED ORDER — SODIUM BICARBONATE 8.4 % IV SOLN
INTRAVENOUS | Status: DC
  Administered 2014-12-21: 18:00:00 via INTRAVENOUS
  Filled 2014-12-21 (×5): qty 150

## 2014-12-21 MED ORDER — SODIUM BICARBONATE 8.4 % IV SOLN
INTRAVENOUS | Status: AC
  Filled 2014-12-21: qty 100

## 2014-12-21 MED ORDER — PRISMASOL BGK 4/2.5 32-4-2.5 MEQ/L IV SOLN
INTRAVENOUS | Status: DC
  Administered 2014-12-21: 15:00:00 via INTRAVENOUS_CENTRAL
  Filled 2014-12-21 (×8): qty 5000

## 2014-12-21 MED ORDER — "THROMBI-PAD 3""X3"" EX PADS"
1.0000 | MEDICATED_PAD | Freq: Once | CUTANEOUS | Status: AC
  Administered 2014-12-21: 1 via TOPICAL
  Filled 2014-12-21: qty 1

## 2014-12-21 MED ORDER — FENTANYL CITRATE (PF) 100 MCG/2ML IJ SOLN
50.0000 ug | Freq: Once | INTRAMUSCULAR | Status: DC
  Filled 2014-12-21: qty 2

## 2014-12-21 MED ORDER — SODIUM CHLORIDE 0.9 % IV SOLN
25.0000 ug/h | INTRAVENOUS | Status: DC
  Administered 2014-12-21: 5 ug/h via INTRAVENOUS
  Filled 2014-12-21 (×2): qty 50

## 2014-12-21 MED ORDER — SODIUM CHLORIDE 0.9 % IV SOLN: 250.0000 mL | INTRAVENOUS | Status: DC | PRN

## 2014-12-21 MED ORDER — DEXTROSE 5 % IV SOLN
0.5000 ug/min | INTRAVENOUS | Status: DC
  Administered 2014-12-21: 0.5 ug/min via INTRAVENOUS
  Administered 2014-12-21: 20 ug/min via INTRAVENOUS
  Filled 2014-12-21 (×3): qty 4

## 2014-12-21 MED ORDER — NOREPINEPHRINE BITARTRATE 1 MG/ML IV SOLN: 0.0000 ug/min | INTRAVENOUS | Status: DC

## 2014-12-21 MED ORDER — MIDAZOLAM HCL 2 MG/2ML IJ SOLN
2.0000 mg | INTRAMUSCULAR | Status: DC | PRN
  Filled 2014-12-21: qty 2

## 2014-12-21 MED ORDER — VANCOMYCIN HCL 10 G IV SOLR
1500.0000 mg | INTRAVENOUS | Status: AC
  Administered 2014-12-21: 1500 mg via INTRAVENOUS
  Filled 2014-12-21: qty 1500

## 2014-12-21 MED ORDER — VANCOMYCIN HCL IN DEXTROSE 1-5 GM/200ML-% IV SOLN: 1000.0000 mg | INTRAVENOUS | Status: DC

## 2014-12-21 MED ORDER — SODIUM CHLORIDE 0.9 % IV BOLUS (SEPSIS)
1000.0000 mL | Freq: Once | INTRAVENOUS | Status: AC
  Administered 2014-12-21: 1000 mL via INTRAVENOUS

## 2014-12-21 MED ORDER — METRONIDAZOLE IN NACL 5-0.79 MG/ML-% IV SOLN
500.0000 mg | Freq: Three times a day (TID) | INTRAVENOUS | Status: DC
  Administered 2014-12-21: 500 mg via INTRAVENOUS
  Filled 2014-12-21 (×4): qty 100

## 2014-12-21 MED ORDER — LACTULOSE 10 GM/15ML PO SOLN
30.0000 g | Freq: Two times a day (BID) | ORAL | Status: DC
  Filled 2014-12-21 (×2): qty 45

## 2014-12-21 MED ORDER — ETOMIDATE 2 MG/ML IV SOLN
INTRAVENOUS | Status: AC
  Filled 2014-12-21: qty 20

## 2014-12-21 MED ORDER — SODIUM BICARBONATE 8.4 % IV SOLN
100.0000 meq | Freq: Once | INTRAVENOUS | Status: AC
  Administered 2014-12-21: 100 meq via INTRAVENOUS
  Filled 2014-12-21: qty 100

## 2014-12-21 MED ORDER — NOREPINEPHRINE BITARTRATE 1 MG/ML IV SOLN
0.0000 ug/min | INTRAVENOUS | Status: DC
  Administered 2014-12-21: 78 ug/min via INTRAVENOUS
  Administered 2014-12-21: 75 ug/min via INTRAVENOUS
  Administered 2014-12-21: 78 ug/min via INTRAVENOUS
  Administered 2014-12-21: 80 ug/min via INTRAVENOUS
  Filled 2014-12-21 (×5): qty 16

## 2014-12-21 MED ORDER — DEXTROSE 5 % IV SOLN: 1.0000 g | INTRAVENOUS | Status: DC

## 2014-12-21 MED ORDER — HYDROCORTISONE NA SUCCINATE PF 100 MG IJ SOLR
50.0000 mg | Freq: Four times a day (QID) | INTRAMUSCULAR | Status: DC
  Administered 2014-12-21 (×2): 50 mg via INTRAVENOUS
  Filled 2014-12-21 (×2): qty 2

## 2014-12-21 MED ORDER — LIDOCAINE HCL (CARDIAC) 20 MG/ML IV SOLN
INTRAVENOUS | Status: AC
  Filled 2014-12-21: qty 5

## 2014-12-21 MED ORDER — SODIUM CHLORIDE 0.9 % IV SOLN
200.0000 mg | Freq: Once | INTRAVENOUS | Status: AC
  Administered 2014-12-21: 200 mg via INTRAVENOUS
  Filled 2014-12-21: qty 200

## 2014-12-21 MED ORDER — PRISMASOL BGK 4/2.5 32-4-2.5 MEQ/L IV SOLN
INTRAVENOUS | Status: DC
  Administered 2014-12-21: 15:00:00 via INTRAVENOUS_CENTRAL
  Filled 2014-12-21 (×2): qty 5000

## 2014-12-21 MED ORDER — PRISMASOL BGK 4/2.5 32-4-2.5 MEQ/L IV SOLN
INTRAVENOUS | Status: DC
  Administered 2014-12-21: 15:00:00 via INTRAVENOUS_CENTRAL
  Filled 2014-12-21: qty 5000

## 2014-12-21 MED ORDER — INSULIN ASPART 100 UNIT/ML ~~LOC~~ SOLN
2.0000 [IU] | SUBCUTANEOUS | Status: DC
  Administered 2014-12-21 (×2): 6 [IU] via SUBCUTANEOUS

## 2014-12-21 MED ORDER — ALBUTEROL SULFATE (2.5 MG/3ML) 0.083% IN NEBU: 2.5000 mg | INHALATION_SOLUTION | RESPIRATORY_TRACT | Status: DC | PRN

## 2014-12-21 MED ORDER — SODIUM CHLORIDE 0.9 % IV SOLN
100.0000 mg | INTRAVENOUS | Status: DC
  Filled 2014-12-21: qty 100

## 2014-12-21 MED ORDER — SODIUM CHLORIDE 0.9 % IV SOLN
INTRAVENOUS | Status: DC
Start: 2014-12-21 — End: 2014-12-21

## 2014-12-21 MED ORDER — VASOPRESSIN 20 UNIT/ML IV SOLN
0.0300 [IU]/min | INTRAVENOUS | Status: DC
  Administered 2014-12-21: 0.03 [IU]/min via INTRAVENOUS
  Filled 2014-12-21 (×2): qty 2

## 2014-12-21 MED ORDER — VITAMIN K1 10 MG/ML IJ SOLN
10.0000 mg | Freq: Once | INTRAVENOUS | Status: AC
  Administered 2014-12-21: 10 mg via INTRAVENOUS
  Filled 2014-12-21: qty 1

## 2014-12-21 MED ORDER — POTASSIUM CHLORIDE 10 MEQ/100ML IV SOLN
10.0000 meq | INTRAVENOUS | Status: AC
  Administered 2014-12-21 (×2): 10 meq via INTRAVENOUS
  Filled 2014-12-21 (×2): qty 100

## 2014-12-21 MED ORDER — IPRATROPIUM-ALBUTEROL 0.5-2.5 (3) MG/3ML IN SOLN
3.0000 mL | Freq: Four times a day (QID) | RESPIRATORY_TRACT | Status: DC
  Administered 2014-12-21 (×2): 3 mL via RESPIRATORY_TRACT
  Filled 2014-12-21 (×3): qty 3

## 2014-12-21 MED ORDER — SODIUM CHLORIDE 0.9 % IV SOLN: Freq: Once | INTRAVENOUS | Status: DC

## 2014-12-21 MED ORDER — FENTANYL BOLUS VIA INFUSION
50.0000 ug | INTRAVENOUS | Status: DC | PRN
  Administered 2014-12-21: 50 ug via INTRAVENOUS
  Filled 2014-12-21: qty 50

## 2014-12-21 MED ORDER — CEFTAZIDIME 1 G IJ SOLR
1.0000 g | INTRAMUSCULAR | Status: AC
Start: 2014-12-21 — End: 2014-12-21
  Administered 2014-12-21: 1 g via INTRAVENOUS
  Filled 2014-12-21: qty 1

## 2014-12-21 MED ORDER — DEXTROSE 50 % IV SOLN
1.0000 | Freq: Once | INTRAVENOUS | Status: AC
  Administered 2014-12-21: 50 mL via INTRAVENOUS
  Filled 2014-12-21: qty 50

## 2014-12-21 MED ORDER — PANTOPRAZOLE SODIUM 40 MG IV SOLR: 40.0000 mg | Freq: Every day | INTRAVENOUS | Status: DC

## 2014-12-21 MED ORDER — NOREPINEPHRINE BITARTRATE 1 MG/ML IV SOLN: 0.0000 ug/min | Freq: Once | INTRAVENOUS | Status: DC

## 2014-12-21 MED ORDER — SODIUM CHLORIDE 0.9 % FOR CRRT
INTRAVENOUS_CENTRAL | Status: DC | PRN
  Filled 2014-12-21: qty 1000

## 2014-12-21 MED ORDER — SODIUM BICARBONATE 8.4 % IV SOLN
INTRAVENOUS | Status: DC
  Administered 2014-12-21: 07:00:00 via INTRAVENOUS
  Filled 2014-12-21: qty 100

## 2014-12-21 MED ORDER — ROCURONIUM BROMIDE 50 MG/5ML IV SOLN
INTRAVENOUS | Status: AC
  Filled 2014-12-21: qty 2

## 2014-12-21 MED ORDER — VANCOMYCIN HCL IN DEXTROSE 750-5 MG/150ML-% IV SOLN
750.0000 mg | INTRAVENOUS | Status: DC
  Filled 2014-12-21: qty 150

## 2014-12-21 MED ORDER — SODIUM BICARBONATE 8.4 % IV SOLN
INTRAVENOUS | Status: AC
  Administered 2014-12-21: 50 meq
  Filled 2014-12-21: qty 50

## 2014-12-21 MED ORDER — SODIUM CHLORIDE 0.9 % IV SOLN
Freq: Once | INTRAVENOUS | Status: AC
  Administered 2014-12-21: 12:00:00 via INTRAVENOUS

## 2014-12-21 MED ORDER — DEXTROSE 5 % IV SOLN
10.0000 mg | Freq: Once | INTRAVENOUS | Status: AC
  Administered 2014-12-21: 10 mg via INTRAVENOUS
  Filled 2014-12-21: qty 1

## 2014-12-21 MED ORDER — SODIUM CHLORIDE 0.9 % IV SOLN
INTRAVENOUS | Status: DC
  Administered 2014-12-21: 07:00:00 via INTRAVENOUS

## 2014-12-21 MED FILL — Medication: Qty: 1 | Status: AC

## 2014-12-21 NOTE — H&P (Signed)
PULMONARY / CRITICAL CARE MEDICINE   Name: [name] [name] MRN: [id number] DOB: 1970/07/07    ADMISSION DATE:  Dec 24, 2014 CONSULTATION DATE:  December 24, 2014  REFERRING MD :  EDP  CHIEF COMPLAINT:  Weakness  INITIAL PRESENTATION: 44 year old female with h/o cirrhosis presenting with weakness. Hypotensive initially in ED improved with volume. Deteriorated and had brief cardiac arrest in ED. Intubated. Presumed sepsis with abdominal source. Intubated. PCCM to admit.   STUDIES:  9/21 > small bibasilar effusions, fatty infiltration of liver, diverticulosis. No definite intra-abdominal abnormalities.  SIGNIFICANT EVENTS: 9/21 > admit  HISTORY OF PRESENT ILLNESS:  44 year old female with PMH as below, which includes HTN, Asthma (has seen MW), and hepatic failure. Reported to ER staff that she is a daily drinker. 9/21 she presented to ED complaining of constant ongoing weakness associated with nausea and SOB. In route she was noted to be profoundly hypotensive with lactic acid elevated at 7.75. BP improved with IVF resuscitation 4 liters. She was alert an oriented at time of arrival, although somewhat lethargic. Labs showed WBC 24, AST 1584, SCr 12.13, INR 4.3, Hgb 10, and mildly elevated troponin. CT of the abdomen was performed and showed fatty infiltration of the liver. PCCM was called to admit. Upon my arrival she was obtunded in ED room. Hemodynamically stable on monitor. Shortly after she lost pulses to PEA and suffered a brief cardiac arrest. ROSC to NSR with one round of epi, and bicarb. PCCM to admit.   PAST MEDICAL HISTORY :   has a past medical history of Hypertension; Asthma; Migraines; Acute renal failure; and Hypothyroid.  has past surgical history that includes Cholecystectomy and Knee surgery. Prior to Admission medications   Medication Sig Start Date End Date Taking? Authorizing Samra Pesch  albuterol (VENTOLIN HFA) 108 (90 BASE) MCG/ACT inhaler Inhale 2 puffs into the lungs every 6 (six)  hours as needed. For shortness of breath    Historical Lular Letson, MD  ALPRAZolam Prudy Feeler) 1 MG tablet Take 1-2 mg by mouth 3 (three) times daily as needed. For anxiety    Historical Kuron Docken, MD  amLODipine (NORVASC) 5 MG tablet Take 5 mg by mouth daily.    Historical Querida Beretta, MD  Ascorbic Acid (VITAMIN C) 100 MG tablet Take 100 mg by mouth daily.    Historical Kamylah Manzo, MD  budesonide-formoterol (SYMBICORT) 160-4.5 MCG/ACT inhaler Inhale 2 puffs into the lungs 2 (two) times daily.    Historical Jerlyn Pain, MD  cetirizine (ZYRTEC) 10 MG tablet Take 10 mg by mouth daily.    Historical Chaitanya Amedee, MD  Coenzyme Q10 (CO Q 10 PO) Take 1 capsule by mouth daily.    Historical Jaline Pincock, MD  cyclobenzaprine (FLEXERIL) 10 MG tablet Take 10 mg by mouth 3 (three) times daily as needed. For pain    Historical Micheline Markes, MD  cycloSPORINE (RESTASIS) 0.05 % ophthalmic emulsion Place 1 drop into both eyes 2 (two) times daily.    Historical Rochester Serpe, MD  eletriptan (RELPAX) 40 MG tablet One tablet by mouth at onset of headache. May repeat in 2 hours if headache persists or recurs. may repeat in 2 hours if necessary    Historical Brittinie Wherley, MD  escitalopram (LEXAPRO) 10 MG tablet Take 10 mg by mouth daily.    Historical Maksim Peregoy, MD  famotidine (PEPCID) 20 MG tablet TAKE ONE TABLET BY MOUTH AT BEDTIME 11/16/12   [name], MD  ibuprofen (ADVIL,MOTRIN) 800 MG tablet Take 800 mg by mouth every 8 (eight) hours as needed.  Historical Jadesola Poynter, MD  losartan (COZAAR) 100 MG tablet Take 100 mg by mouth daily.    Historical Riely Oetken, MD  Multiple Vitamins-Minerals (WOMENS MULTI PO) Take 1 tablet by mouth daily.    Historical Gean Larose, MD  nitrofurantoin (MACRODANTIN) 100 MG capsule Take 100 mg by mouth 2 (two) times daily.    Historical Savien Mamula, MD  Olopatadine HCl (PATADAY) 0.2 % SOLN Place 1 drop into both eyes daily.     Historical Shakisha Abend, MD  omeprazole (PRILOSEC) 20 MG capsule Take 20 mg by mouth daily.    Historical  Deep Bonawitz, MD  pantoprazole (PROTONIX) 40 MG tablet Take 40 mg by mouth daily.    Historical Vernita Tague, MD  Potassium 99 MG TABS Take 1 tablet by mouth daily.    Historical Redonna Wilbert, MD  Probiotic Product (PROBIOTIC DAILY PO) Take 1 capsule by mouth daily.    Historical Charleen Madera, MD  SUMAtriptan Succinate (SUMAVEL DOSEPRO [place]) Inject 1 mL into the skin daily as needed. For severe migraine    Historical Jadine Brumley, MD  thyroid (ARMOUR) 30 MG tablet Take 30 mg by mouth daily.    Historical Eeva Schlosser, MD  Topiramate (TOPAMAX PO) Take 75 mg by mouth daily.    Historical Jairen Goldfarb, MD  traZODone (DESYREL) 50 MG tablet Take 50 mg by mouth at bedtime as needed. For insomnia    Historical Jailee Jaquez, MD   Allergies  Allergen Reactions  . Amoxicillin Anhydrous [Amoxicillin] Hives  . Ciprofloxacin Hives    FAMILY HISTORY:  has no family status information on file.  SOCIAL HISTORY:  reports that she has never smoked. She has never used smokeless tobacco. She reports that she drinks alcohol. She reports that she does not use illicit drugs.  REVIEW OF SYSTEMS:  Unable, obtunded  SUBJECTIVE:   VITAL SIGNS: Temp:  [97.4 F (36.3 C)-97.6 F (36.4 C)] 97.6 F (36.4 C) (09/21 0141) Pulse Rate:  [85-99] 85 (09/21 0407) Resp:  [16-42] 22 (09/21 0400) BP: (60-118)/(40-79) 86/67 mmHg (09/21 0407) SpO2:  [88 %-95 %] 89 % (09/21 0407) FiO2 (%):  [100 %] 100 % (09/21 0401) Weight:  [70.308 kg (155 lb)] 70.308 kg (155 lb) (09/21 0132) HEMODYNAMICS:   VENTILATOR SETTINGS: Vent Mode:  [-] PRVC FiO2 (%):  [100 %] 100 % Set Rate:  [22 bmp] 22 bmp Vt Set:  [520 mL] 520 mL PEEP:  [5 cmH20] 5 cmH20 INTAKE / OUTPUT:  Intake/Output Summary (Last 24 hours) at 2014/12/24 0414 Last data filed at Dec 24, 2014 0250  Gross per 24 hour  Intake   2000 ml  Output      0 ml  Net   2000 ml    PHYSICAL EXAMINATION: General:  Obese female in NAD Neuro: obtunded, pupils fixed HEENT:  [place]/AT, no JVD Cardiovascular:  RRR, no  MRG Lungs:  Clear bilateral breath sounds Abdomen:  Soft, fluctuant, non-distended Musculoskeletal:  No acute deformity Skin:  Mottled   LABS:  CBC  Recent Labs Lab 2014/12/24 0145 12/24/2014 0214  WBC 24.8*  --   HGB 10.9* 13.6  HCT 32.2* 40.0  PLT 156  --    Coag's  Recent Labs Lab Dec 24, 2014 0145  APTT 118*  INR 4.35*   BMET  Recent Labs Lab 2014/12/24 0145 2014/12/24 0214  NA 127* 127*  K 3.5 3.5  CL 91* 96*  CO2 7*  --   BUN 47* 42*  CREATININE 12.13* 12.40*  GLUCOSE 74 69   Electrolytes  Recent Labs Lab 2014-12-24 0145  CALCIUM 6.5*  Sepsis Markers  Recent Labs Lab 12/09/2014 0224  LATICACIDVEN 7.75*   ABG  Recent Labs Lab 12/04/2014 0409  PHART 6.971*  PCO2ART 30.6*  PO2ART 255.0*   Liver Enzymes  Recent Labs Lab 12/30/2014 0145  AST 1584*  ALT 194*  ALKPHOS 704*  BILITOT 5.6*  ALBUMIN 1.7*   Cardiac Enzymes No results for input(s): TROPONINI, PROBNP in the last 168 hours. Glucose  Recent Labs Lab 12/03/2014 0316 12/28/2014 0343  GLUCAP 64* 147*    Imaging Ct Abdomen Pelvis Wo Contrast  12/13/2014   CLINICAL DATA:  Generalized abdominal pain, nausea, vomiting, weakness, hypertension, asthma  EXAM: CT ABDOMEN AND PELVIS WITHOUT CONTRAST  TECHNIQUE: Multidetector CT imaging of the abdomen and pelvis was performed following the standard protocol without IV contrast.  COMPARISON:  12/10/2011  FINDINGS: Small bibasilar pleural effusions and atelectasis.  Diffuse fatty infiltration and mild enlargement of liver.  Gallbladder surgically absent.  Streak artifacts traverse upper abdomen from patient's arms.  Small nonobstructing calculus inferior pole LEFT kidney.  No other focal abnormalities of the liver, spleen, pancreas, kidneys, or adrenal glands.  Normal appearance of uterus, adnexa and ureters.  Bladder decompressed.  Normal appendix.  Minimal sigmoid diverticulosis.  Few air-filled mildly prominent small bowel loops in the anterior abdomen  without definite evidence of bowel obstruction.  No mass, adenopathy, free air or free fluid.  Bones unremarkable.  IMPRESSION: Bibasilar small pleural effusions and atelectasis.  Diffuse fatty infiltration enlargement of liver.  Minimal sigmoid diverticulosis.  No definite acute intra-abdominal or intrapelvic abnormalities.   Electronically Signed   By: Ulyses Southward M.D.   On: 12/25/2014 03:35   Dg Chest Port 1 View  12/20/2014   CLINICAL DATA:  Hypertension, achy level failure, constant ongoing generalized weakness, nausea, shortness of breath, hypertension, asthma  EXAM: PORTABLE CHEST - 1 VIEW  COMPARISON:  Portable exam 0209 hr compared to 12/10/2011  FINDINGS: Normal heart size, mediastinal contours and pulmonary vascularity.  Elevation of RIGHT diaphragm with RIGHT basilar atelectasis.  Upper lungs clear.  No pleural effusion or pneumothorax.  IMPRESSION: Elevation of RIGHT diaphragm with RIGHT basilar atelectasis.   Electronically Signed   By: Ulyses Southward M.D.   On: 12/30/2014 03:14     ASSESSMENT / PLAN:  PULMONARY OETT 9/21 >>> A: Acute respiratory failure due to inability to protect airway Elevated R hemidiaphragm  P:   Vent support CXR ABG VAP bundle BDs  CARDIOVASCULAR CVL R fem placed sterile in ED 9/21 >>> A:  Shock suspect septic Cardiac arrest Elevated troponin > suspect demand ischemia H/o HTN  P:  Tele MAP goal > 65 Levophed for MAP goal Volume/Blood product administration.  Hold home amlodipine, losartan Trend lactic Trend troponin Place Arterial line once FFP infused.  RENAL A:   Acute Kidney Injury > suspect pre-renal Hypokalemia Hyponatremia > beer potomania High AG metabolic acidosis - lactic Hemoccult negative  P:   Bmet q 4 Replace electrolytes as indicated Will try to avoid HCO3 infusion May need CRRT if unable to improve  GASTROINTESTINAL A:   Hepatic failure - cirrhosis + shock component ?Cirrhosis ETOH abuse  P:   NPO Trend  LFT  HEMATOLOGIC A:   Coagulopathy r/t hepatic failure Anemia  P:  Vit K FFP x 3 Follow CBC Transfuse per ICU guidelines  INFECTIOUS A:   Severe sepsis Suspect SBP, also concern UTI  P:   BCx2 9/21 > UC 9/21 > Sputum 9/21 > Abx: Ceftaz, start date 9/21 > Abx:  Vanc, start date 9/21 > PCT  ENDOCRINE A:   Hypothyroid  P:   Check TSH CBG monitoring and SSI  NEUROLOGIC A:   Acute metabolic encephalopathy, hepatic component?  P:   RASS goal: 0 Fentanyl infusion PRN versed Will need lactulose once stable   FAMILY  - Updates: no family available  - Inter-disciplinary family meet or Palliative Care meeting due by:  9/28 >>>   Joneen Roach, AGACNP-BC [facility] Pager [phone] or [phone]  12-25-14 4:38 AM  Attending Note:  44 year old female with history of etoh abuse presenting feeling tired.  In ED was found to be in renal failure and profoundly acidotic.  Patient lost her mental status then had a brief cardiac arrest for 3 minutes with successful resuscitation.  Interestingly, INR is elevated and NH3 is elevated but AST is 1000 and no ascites present.  Not sure if liver failure is acute on chronic, unlikely to be cirrhotic.  Also the renal function is somewhat of a puzzle as the BUN:CR ratio 4:1 inconsistent with hepatorenal or pre renal azotemia.  The bladder was decompressed on CT however making post obstructive unlikely.  SBP was also a concern however no ascites is seen on CT to warrant a paracentesis.  Will aggressively hydrate and follow BMET.  Will start broad spectrum abx for now.  Head CT ordered.  Lactulose for elevated ammonia.  F/U on pan culture.  Patient is in severe metabolic acidosis.  Will repeat ABG now.  Since patient is aneuric would consider calling renal in AM for ?CRRT.  Hypotension likely a combination of sepsis and acidosis.  Will repeat ABG now if continues to be metabolically acidotic will consider bicarb  drip.  Will f/u.  The patient is critically ill with multiple organ systems failure and requires high complexity decision making for assessment and support, frequent evaluation and titration of therapies, application of advanced monitoring technologies and extensive interpretation of multiple databases.   Critical Care Time devoted to patient care services described in this note is  90  Minutes. This time reflects time of care of this signee Dr Koren Bound. This critical care time does not reflect procedure time, or teaching time or supervisory time of PA/NP/Med student/Med Resident etc but could involve care discussion time.  Alyson Reedy, M.D. [facility] Pulmonary/Critical Care Medicine. Pager: [phone]. After hours pager: [phone].

## 2014-12-21 NOTE — ED Notes (Signed)
Dr. Ward at the bedside.  

## 2014-12-21 NOTE — ED Notes (Signed)
Another  of versed given.

## 2014-12-21 NOTE — ED Notes (Signed)
Levophed drip increased to 7.5mg /min

## 2014-12-21 NOTE — Progress Notes (Signed)
ANTIBIOTIC CONSULT NOTE - INITIAL  Pharmacy Consult for Vancomycin and Ceftazidime Indication: sepsis  Allergies  Allergen Reactions  . Amoxicillin Anhydrous [Amoxicillin] Hives  . Ciprofloxacin Hives    Patient Measurements: Height:  (162.6 cm) Weight: 155 lb (70.308 kg) IBW/kg (Calculated) : 54.7  Vital Signs: Temp: 97.6 F (36.4 C) (09/21 0141) Temp Source: Rectal (09/21 0141) BP: 86/67 mmHg (09/21 0407) Pulse Rate: 85 (09/21 0407) Intake/Output from previous day: 09/20 0701 - 09/21 0700 In: 2000 [I.V.:2000] Out: -  Intake/Output from this shift: Total I/O In: 2000 [I.V.:2000] Out: -   Labs:  Recent Labs  12-28-14 0145 28-Dec-2014 0214  WBC 24.8*  --   HGB 10.9* 13.6  PLT 156  --   CREATININE 12.13* 12.40*   Estimated Creatinine Clearance: 5.6 mL/min (by C-G formula based on Cr of 12.4). No results for input(s): VANCOTROUGH, VANCOPEAK, VANCORANDOM, GENTTROUGH, GENTPEAK, GENTRANDOM, TOBRATROUGH, TOBRAPEAK, TOBRARND, AMIKACINPEAK, AMIKACINTROU, AMIKACIN in the last 72 hours.   Microbiology: No results found for this or any previous visit (from the past 720 hour(s)).  Medical History: Past Medical History  Diagnosis Date  . Hypertension   . Asthma   . Migraines   . Acute renal failure   . Hypothyroid     Medications:  Awaiting med rec  Assessment: 44 y.o. F presents with weakness. Noted to be hypotensive in ED and had brief cardiac arrest. Required intubation in ED. To begin Vancomycin and Fortaz for sepsis (SBP vs UTI). LA 7.75. Pt with ARF - SCr up to 12.4 with CrCl < 10 ml/min. WBC elevated to 24.8. Cultures pending.  Goal of Therapy:  Vancomycin trough level 15-20 mcg/ml  Plan:  Vancomycin  IV now then 1gm IV q48h Ceftazidime 1gm IV q48h Will f/u micro data, pt's clinical condition and renal function Consider random vanc level ~48h to see if pt clearing  Christoper Fabian, PharmD, BCPS Clinical pharmacist, pager [phone] 28-Dec-2014,4:38  AM

## 2014-12-21 NOTE — ED Notes (Signed)
Pulse check, weak.  With spontaneous respirations. 20 of etomidate given. 100 of succ given after.

## 2014-12-21 NOTE — Consult Note (Signed)
[facility] Surgery Consult Note  [name] [name] 21-Nov-1970  [id number].    Requesting MD: Dr. Titus Mould Chief Complaint/Reason for Consult: Sepsis with questionable abdominal source  HPI:  44 y/o female with PMH of alcoholic cirrhosis, acute liver failure, and chronic abdominal pain presented to ED via EMS with a cc of weakness. History gathered from ED/IM notes, as pt is sedated and intubated. She reported nausea and several days of vomiting and diarrhea. Denies black stools. Drinks alcohol daily. Denied recent fever, chills, CP.   In ED pt initially improved with IV fluids, labs showed WBC count 24.8 and code sepsis was initiated. Deteriorated with brief cardiac arrest in the ED. Pt intubated. Admitted to CCM. Central line and Arterial catheter placed. Suspect sepsis with abdominal source. Pt has received 4 units ffp.   ROS: Unable to obtain due to pt being intubated, sedated, and in critical condition.  Family History  Problem Relation Age of Onset  . Allergies Maternal Grandmother   . Allergies Paternal Grandmother   . Asthma Paternal Grandmother   . Asthma Sister   . Heart disease Paternal Grandfather   . Clotting disorder Paternal Grandfather   . Lung cancer Paternal Grandfather     smoked and had exposure to chemicals  . Thyroid cancer Maternal Aunt     Past Medical History  Diagnosis Date  . Hypertension   . Asthma   . Migraines   . Acute renal failure   . Hypothyroid     Past Surgical History  Procedure Laterality Date  . Cholecystectomy    . Knee surgery      Social History:  reports that she has never smoked. She has never used smokeless tobacco. She reports that she drinks alcohol. She reports that she does not use illicit drugs.  Allergies:  Allergies  Allergen Reactions  . Amoxicillin Anhydrous [Amoxicillin] Hives  . Ciprofloxacin Hives    Medications Prior to Admission  Medication Sig Dispense Refill  . albuterol (VENTOLIN HFA) 108 (90  BASE) MCG/ACT inhaler Inhale 2 puffs into the lungs every 6 (six) hours as needed. For shortness of breath    . ALPRAZolam (XANAX) 1 MG tablet Take 1-2 mg by mouth 3 (three) times daily as needed. For anxiety    . amLODipine (NORVASC) 5 MG tablet Take 5 mg by mouth daily.    . Ascorbic Acid (VITAMIN C) 100 MG tablet Take 100 mg by mouth daily.    . budesonide-formoterol (SYMBICORT) 160-4.5 MCG/ACT inhaler Inhale 2 puffs into the lungs 2 (two) times daily.    . cetirizine (ZYRTEC) 10 MG tablet Take 10 mg by mouth daily.    . Coenzyme Q10 (CO Q 10 PO) Take 1 capsule by mouth daily.    . cyclobenzaprine (FLEXERIL) 10 MG tablet Take 10 mg by mouth 3 (three) times daily as needed. For pain    . cycloSPORINE (RESTASIS) 0.05 % ophthalmic emulsion Place 1 drop into both eyes 2 (two) times daily.    Marland Kitchen eletriptan (RELPAX) 40 MG tablet One tablet by mouth at onset of headache. May repeat in 2 hours if headache persists or recurs. may repeat in 2 hours if necessary    . escitalopram (LEXAPRO) 10 MG tablet Take 10 mg by mouth daily.    . famotidine (PEPCID) 20 MG tablet TAKE ONE TABLET BY MOUTH AT BEDTIME 30 tablet 2  . ibuprofen (ADVIL,MOTRIN) 800 MG tablet Take 800 mg by mouth every 8 (eight) hours as needed.    Marland Kitchen  losartan (COZAAR) 100 MG tablet Take 100 mg by mouth daily.    . Multiple Vitamins-Minerals (WOMENS MULTI PO) Take 1 tablet by mouth daily.    . nitrofurantoin (MACRODANTIN) 100 MG capsule Take 100 mg by mouth 2 (two) times daily.    . Olopatadine HCl (PATADAY) 0.2 % SOLN Place 1 drop into both eyes daily.     Marland Kitchen omeprazole (PRILOSEC) 20 MG capsule Take 20 mg by mouth daily.    . pantoprazole (PROTONIX) 40 MG tablet Take 40 mg by mouth daily.    . Potassium 99 MG TABS Take 1 tablet by mouth daily.    . Probiotic Product (PROBIOTIC DAILY PO) Take 1 capsule by mouth daily.    . SUMAtriptan Succinate (SUMAVEL DOSEPRO [place]) Inject 1 mL into the skin daily as needed. For severe migraine    . thyroid  (ARMOUR) 30 MG tablet Take 30 mg by mouth daily.    . Topiramate (TOPAMAX PO) Take 75 mg by mouth daily.    . traZODone (DESYREL) 50 MG tablet Take 50 mg by mouth at bedtime as needed. For insomnia      Blood pressure 101/69, pulse 98, temperature 95.1 F (35.1 C), temperature source Rectal, resp. rate 36, height _0  (1.626 m), weight 80.7 kg (177 lb 14.6 oz), SpO2 94 %. Physical Exam: In-Room Vitals: HR 89 BPM, SpO2 89, RR 36; 5 mL urine OP General: obese, white female sedated and intubated in the bed HEENT: head is normocephalic, atraumatic. Scleral icterus and jaundice of face and mucous membranes.  Ears and nose without any masses or lesions. Heart: regular, rate, and rhythm.  No obvious murmurs, gallops, or rubs noted. posterior tibialis and pedal pulses non-palpable bilaterally Lungs: CTAB, no wheezes, rhonchi, or rales noted.  On ventilator.  Abd: soft, diffusely tender, especially in the LLQ and RLQ, moderately distended, hypoactive bowel sounds, no masses, hernias, or organomegaly. No prior abdominal surgical scars noted. ecchymosis of BL flank.  MS: all 4 extremities are symmetrical with no cyanosis or clubbing. Upper extremity edema present BL. Skin: UE warm and dry with multiple hematomas BL at IV sites and upper arms. LE cold and mottled BL.   Neuro/Psych: obtunded, pupils fixed.    Results for orders placed or performed during the hospital encounter of 12/04/2014 (from the past 48 hour(s))  CBC with Differential     Status: Abnormal   Collection Time: 12/09/2014  1:45 AM  Result Value Ref Range   WBC 24.8 (H) 4.0 - 10.5 K/uL   RBC 2.96 (L) 3.87 - 5.11 MIL/uL   Hemoglobin 10.9 (L) 12.0 - 15.0 g/dL   HCT 32.2 (L) 36.0 - 46.0 %   MCV 108.8 (H) 78.0 - 100.0 fL   MCH 36.8 (H) 26.0 - 34.0 pg   MCHC 33.9 30.0 - 36.0 g/dL   RDW 17.0 (H) 11.5 - 15.5 %   Platelets 156 150 - 400 K/uL   Neutrophils Relative % 88 %   Neutro Abs 21.7 (H) 1.7 - 7.7 K/uL   Lymphocytes Relative 7 %    Lymphs Abs 1.7 0.7 - 4.0 K/uL   Monocytes Relative 5 %   Monocytes Absolute 1.3 (H) 0.1 - 1.0 K/uL   Eosinophils Relative 0 %   Eosinophils Absolute 0.0 0.0 - 0.7 K/uL   Basophils Relative 0 %   Basophils Absolute 0.0 0.0 - 0.1 K/uL  Comprehensive metabolic panel     Status: Abnormal   Collection Time: 12/13/2014  1:45 AM  Result Value Ref Range   Sodium 127 (L) 135 - 145 mmol/L   Potassium 3.5 3.5 - 5.1 mmol/L   Chloride 91 (L) 101 - 111 mmol/L   CO2 7 (L) 22 - 32 mmol/L   Glucose, Bld 74 65 - 99 mg/dL   BUN 47 (H) 6 - 20 mg/dL   Creatinine, Ser 12.13 (H) 0.44 - 1.00 mg/dL   Calcium 6.5 (L) 8.9 - 10.3 mg/dL   Total Protein 5.2 (L) 6.5 - 8.1 g/dL   Albumin 1.7 (L) 3.5 - 5.0 g/dL   AST 1584 (H) 15 - 41 U/L   ALT 194 (H) 14 - 54 U/L   Alkaline Phosphatase 704 (H) 38 - 126 U/L   Total Bilirubin 5.6 (H) 0.3 - 1.2 mg/dL   GFR calc non Af Amer 3 (L) >60 mL/min   GFR calc Af Amer 4 (L) >60 mL/min    Comment: (NOTE) The eGFR has been calculated using the CKD EPI equation. This calculation has not been validated in all clinical situations. eGFR's persistently <60 mL/min signify possible Chronic Kidney Disease.    Anion gap 29 (H) 5 - 15  Lipase, blood     Status: None   Collection Time: 12/16/2014  1:45 AM  Result Value Ref Range   Lipase 44 22 - 51 U/L  APTT     Status: Abnormal   Collection Time: 12/05/2014  1:45 AM  Result Value Ref Range   aPTT 118 (H) 24 - 37 seconds    Comment:        IF BASELINE aPTT IS ELEVATED, SUGGEST PATIENT RISK ASSESSMENT BE USED TO DETERMINE APPROPRIATE ANTICOAGULANT THERAPY.   Protime-INR     Status: Abnormal   Collection Time: 12/28/2014  1:45 AM  Result Value Ref Range   Prothrombin Time 40.5 (H) 11.6 - 15.2 seconds   INR 4.35 (H) 0.00 - 1.49  Ammonia     Status: Abnormal   Collection Time: 12/07/2014  1:45 AM  Result Value Ref Range   Ammonia 83 (H) 9 - 35 umol/L  ABO/Rh     Status: None   Collection Time: 12/01/2014  1:45 AM  Result Value Ref  Range   ABO/RH(D) A NEG   Urinalysis, Routine w reflex microscopic (not at [facility])     Status: Abnormal   Collection Time: 12/23/2014  1:54 AM  Result Value Ref Range   Color, Urine GREEN (A) YELLOW    Comment: BIOCHEMICALS MAY BE AFFECTED BY COLOR   APPearance TURBID (A) CLEAR   Specific Gravity, Urine 1.025 1.005 - 1.030   pH 6.0 5.0 - 8.0   Glucose, UA 500 (A) NEGATIVE mg/dL   Hgb urine dipstick LARGE (A) NEGATIVE   Bilirubin Urine SMALL (A) NEGATIVE   Ketones, ur NEGATIVE NEGATIVE mg/dL   Protein, ur >300 (A) NEGATIVE mg/dL   Urobilinogen, UA 0.2 0.0 - 1.0 mg/dL   Nitrite NEGATIVE NEGATIVE   Leukocytes, UA NEGATIVE NEGATIVE  Urine microscopic-add on     Status: Abnormal   Collection Time: 12/17/2014  1:54 AM  Result Value Ref Range   Squamous Epithelial / LPF FEW (A) RARE   WBC, UA 3-6 <3 WBC/hpf   RBC / HPF 3-6 <3 RBC/hpf   Bacteria, UA MANY (A) RARE   Casts GRANULAR CAST (A) NEGATIVE  Sodium, urine, random     Status: None   Collection Time: 12/25/2014  1:54 AM  Result Value Ref Range   Sodium, Ur 37 mmol/L  Creatinine,  urine, random     Status: None   Collection Time: 12/18/2014  1:54 AM  Result Value Ref Range   Creatinine, Urine 88.43 mg/dL  Ethanol     Status: None   Collection Time: 12/26/2014  2:02 AM  Result Value Ref Range   Alcohol, Ethyl (B) <5 <5 mg/dL    Comment:        LOWEST DETECTABLE LIMIT FOR SERUM ALCOHOL IS 5 mg/dL FOR MEDICAL PURPOSES ONLY   I-stat chem 8, ed     Status: Abnormal   Collection Time: 12/03/2014  2:14 AM  Result Value Ref Range   Sodium 127 (L) 135 - 145 mmol/L   Potassium 3.5 3.5 - 5.1 mmol/L   Chloride 96 (L) 101 - 111 mmol/L   BUN 42 (H) 6 - 20 mg/dL   Creatinine, Ser 12.40 (H) 0.44 - 1.00 mg/dL   Glucose, Bld 69 65 - 99 mg/dL   Calcium, Ion 0.74 (L) 1.12 - 1.23 mmol/L   TCO2 8 0 - 100 mmol/L   Hemoglobin 13.6 12.0 - 15.0 g/dL   HCT 40.0 36.0 - 46.0 %  I-stat troponin, ED     Status: Abnormal   Collection Time: 12/30/2014  2:20 AM   Result Value Ref Range   Troponin i, poc 0.41 (HH) 0.00 - 0.08 ng/mL   Comment NOTIFIED PHYSICIAN    Comment 3            Comment: Due to the release kinetics of cTnI, a negative result within the first hours of the onset of symptoms does not rule out myocardial infarction with certainty. If myocardial infarction is still suspected, repeat the test at appropriate intervals.   I-Stat CG4 Lactic Acid, ED  (not at  [facility])     Status: Abnormal   Collection Time: 12/03/2014  2:24 AM  Result Value Ref Range   Lactic Acid, Venous 7.75 (HH) 0.5 - 2.0 mmol/L   Comment NOTIFIED PHYSICIAN   CBG monitoring, ED     Status: Abnormal   Collection Time: 12/19/2014  3:16 AM  Result Value Ref Range   Glucose-Capillary 64 (L) 65 - 99 mg/dL  CBG monitoring, ED     Status: Abnormal   Collection Time: 12/24/2014  3:43 AM  Result Value Ref Range   Glucose-Capillary 147 (H) 65 - 99 mg/dL  I-Stat arterial blood gas, ED     Status: Abnormal   Collection Time: 12/28/2014  4:09 AM  Result Value Ref Range   pH, Arterial 6.971 (LL) 7.350 - 7.450   pCO2 arterial 30.6 (L) 35.0 - 45.0 mmHg   pO2, Arterial 255.0 (H) 80.0 - 100.0 mmHg   Bicarbonate 7.1 (L) 20.0 - 24.0 mEq/L   TCO2 8 0 - 100 mmol/L   O2 Saturation 100.0 %   Acid-base deficit 23.0 (H) 0.0 - 2.0 mmol/L   Patient temperature 97.9 F    Collection site BRACHIAL ARTERY    Drawn by RT    Sample type ARTERIAL    Comment NOTIFIED PHYSICIAN   Prepare fresh frozen plasma     Status: None (Preliminary result)   Collection Time: 12/17/2014  4:16 AM  Result Value Ref Range   Unit Number [id number]    Blood Component Type THAWED PLASMA    Unit division 00    Status of Unit ISSUED    Unit tag comment VERBAL ORDERS PER DR SIMON    Transfusion Status OK TO TRANSFUSE    Unit Number [id number]  Blood Component Type THAWED PLASMA    Unit division 00    Status of Unit ISSUED    Unit tag comment VERBAL ORDERS PER DR SIMON    Transfusion Status OK TO  TRANSFUSE    Unit Number [id number]    Blood Component Type THAWED PLASMA    Unit division 00    Status of Unit ISSUED    Unit tag comment VERBAL ORDERS PER DR SIMON    Transfusion Status OK TO TRANSFUSE   I-stat troponin, ED     Status: Abnormal   Collection Time: 12/08/2014  5:27 AM  Result Value Ref Range   Troponin i, poc 0.50 (HH) 0.00 - 0.08 ng/mL   Comment NOTIFIED PHYSICIAN    Comment 3            Comment: Due to the release kinetics of cTnI, a negative result within the first hours of the onset of symptoms does not rule out myocardial infarction with certainty. If myocardial infarction is still suspected, repeat the test at appropriate intervals.   Magnesium     Status: None   Collection Time: 12/27/2014  5:45 AM  Result Value Ref Range   Magnesium 2.4 1.7 - 2.4 mg/dL  Phosphorus     Status: Abnormal   Collection Time: 12/26/2014  5:45 AM  Result Value Ref Range   Phosphorus 10.1 (H) 2.5 - 4.6 mg/dL  Amylase     Status: None   Collection Time: 12/30/2014  5:45 AM  Result Value Ref Range   Amylase 64 28 - 100 U/L  Troponin I     Status: Abnormal   Collection Time: 12/07/2014  5:45 AM  Result Value Ref Range   Troponin I 0.41 (H) <0.031 ng/mL    Comment:        PERSISTENTLY INCREASED TROPONIN VALUES IN THE RANGE OF 0.04-0.49 ng/mL CAN BE SEEN IN:       -UNSTABLE ANGINA       -CONGESTIVE HEART FAILURE       -MYOCARDITIS       -CHEST TRAUMA       -ARRYHTHMIAS       -LATE PRESENTING MYOCARDIAL INFARCTION       -COPD   CLINICAL FOLLOW-UP RECOMMENDED.   Basic metabolic panel     Status: Abnormal   Collection Time: 12/23/2014  5:45 AM  Result Value Ref Range   Sodium 130 (L) 135 - 145 mmol/L   Potassium 3.4 (L) 3.5 - 5.1 mmol/L   Chloride 98 (L) 101 - 111 mmol/L   CO2 7 (L) 22 - 32 mmol/L   Glucose, Bld 176 (H) 65 - 99 mg/dL   BUN 37 (H) 6 - 20 mg/dL   Creatinine, Ser 9.47 (H) 0.44 - 1.00 mg/dL   Calcium 5.2 (LL) 8.9 - 10.3 mg/dL    Comment: CRITICAL RESULT CALLED  TO, READ BACK BY AND VERIFIED WITH: HAYES C,RN 12/17/2014 0624 WAYK    GFR calc non Af Amer 4 (L) >60 mL/min   GFR calc Af Amer 5 (L) >60 mL/min    Comment: (NOTE) The eGFR has been calculated using the CKD EPI equation. This calculation has not been validated in all clinical situations. eGFR's persistently <60 mL/min signify possible Chronic Kidney Disease.    Anion gap 25 (H) 5 - 15  I-STAT 3, arterial blood gas (G3+)     Status: Abnormal   Collection Time: 12/23/2014  6:28 AM  Result Value Ref Range   pH, Arterial 7.144 (  LL) 7.350 - 7.450   pCO2 arterial 19.8 (LL) 35.0 - 45.0 mmHg   pO2, Arterial 105.0 (H) 80.0 - 100.0 mmHg   Bicarbonate 6.9 (L) 20.0 - 24.0 mEq/L   TCO2 8 0 - 100 mmol/L   O2 Saturation 97.0 %   Acid-base deficit 20.0 (H) 0.0 - 2.0 mmol/L   Patient temperature 97.0 F    Sample type ARTERIAL    Comment NOTIFIED PHYSICIAN   Basic metabolic panel     Status: Abnormal   Collection Time: 12/10/2014  6:42 AM  Result Value Ref Range   Sodium 129 (L) 135 - 145 mmol/L   Potassium 3.3 (L) 3.5 - 5.1 mmol/L   Chloride 97 (L) 101 - 111 mmol/L   CO2 7 (L) 22 - 32 mmol/L   Glucose, Bld 210 (H) 65 - 99 mg/dL   BUN 37 (H) 6 - 20 mg/dL   Creatinine, Ser 9.50 (H) 0.44 - 1.00 mg/dL   Calcium 5.4 (LL) 8.9 - 10.3 mg/dL    Comment: CRITICAL RESULT CALLED TO, READ BACK BY AND VERIFIED WITH: ROBINS L RN 12/04/2014 0726 COSTELLO B    GFR calc non Af Amer 4 (L) >60 mL/min   GFR calc Af Amer 5 (L) >60 mL/min    Comment: (NOTE) The eGFR has been calculated using the CKD EPI equation. This calculation has not been validated in all clinical situations. eGFR's persistently <60 mL/min signify possible Chronic Kidney Disease.    Anion gap 25 (H) 5 - 15  CBC     Status: Abnormal   Collection Time: 12/25/2014  6:42 AM  Result Value Ref Range   WBC 26.9 (H) 4.0 - 10.5 K/uL   RBC 2.28 (L) 3.87 - 5.11 MIL/uL   Hemoglobin 8.8 (L) 12.0 - 15.0 g/dL    Comment: REPEATED TO VERIFY   HCT 25.2 (L)  36.0 - 46.0 %   MCV 110.5 (H) 78.0 - 100.0 fL   MCH 38.6 (H) 26.0 - 34.0 pg   MCHC 34.9 30.0 - 36.0 g/dL   RDW 17.4 (H) 11.5 - 15.5 %   Platelets 120 (L) 150 - 400 K/uL  TSH     Status: None   Collection Time: 12/20/2014  6:42 AM  Result Value Ref Range   TSH 1.679 0.350 - 4.500 uIU/mL  Procalcitonin - Baseline     Status: None   Collection Time: 12/11/2014  6:46 AM  Result Value Ref Range   Procalcitonin 19.66 ng/mL    Comment:        Interpretation: PCT >= 10 ng/mL: Important systemic inflammatory response, almost exclusively due to severe bacterial sepsis or septic shock. (NOTE)         ICU PCT Algorithm               Non ICU PCT Algorithm    ----------------------------     ------------------------------         PCT < 0.25 ng/mL                 PCT < 0.1 ng/mL     Stopping of antibiotics            Stopping of antibiotics       strongly encouraged.               strongly encouraged.    ----------------------------     ------------------------------       PCT level decrease by  PCT < 0.25 ng/mL       >= 80% from peak PCT       OR PCT 0.25 - 0.5 ng/mL          Stopping of antibiotics                                             encouraged.     Stopping of antibiotics           encouraged.    ----------------------------     ------------------------------       PCT level decrease by              PCT >= 0.25 ng/mL       < 80% from peak PCT        AND PCT >= 0.5 ng/mL             Continuing antibiotics                                              encouraged.       Continuing antibiotics            encouraged.    ----------------------------     ------------------------------     PCT level increase compared          PCT > 0.5 ng/mL         with peak PCT AND          PCT >= 0.5 ng/mL             Escalation of antibiotics                                          strongly encouraged.      Escalation of antibiotics        strongly encouraged.   Protime-INR      Status: Abnormal   Collection Time: 12/10/2014  6:46 AM  Result Value Ref Range   Prothrombin Time 31.1 (H) 11.6 - 15.2 seconds   INR 3.07 (H) 0.00 - 1.49  MRSA PCR Screening     Status: None   Collection Time: 12/02/2014  6:58 AM  Result Value Ref Range   MRSA by PCR NEGATIVE NEGATIVE    Comment:        The GeneXpert MRSA Assay (FDA approved for NASAL specimens only), is one component of a comprehensive MRSA colonization surveillance program. It is not intended to diagnose MRSA infection nor to guide or monitor treatment for MRSA infections.   Lactic acid, plasma     Status: Abnormal   Collection Time: 12/12/2014  7:03 AM  Result Value Ref Range   Lactic Acid, Venous 7.4 (HH) 0.5 - 2.0 mmol/L    Comment: CRITICAL RESULT CALLED TO, READ BACK BY AND VERIFIED WITH: ROBBINS L RN 12/16/2014 0832 COSTELLO B   Glucose, capillary     Status: Abnormal   Collection Time: 12/12/2014  7:55 AM  Result Value Ref Range   Glucose-Capillary 232 (H) 65 - 99 mg/dL  Provider-confirm verbal Blood Bank order - FFP; 3 Units; Order taken: 12/08/2014; 4:32 AM; Emergency Release, STAT  Status: None   Collection Time: 12/29/2014 10:19 AM  Result Value Ref Range   Blood product order confirm MD AUTHORIZATION REQUESTED   Prepare fresh frozen plasma     Status: None (Preliminary result)   Collection Time: 12/26/2014 10:51 AM  Result Value Ref Range   Unit Number [id number]    Blood Component Type THAWED PLASMA    Unit division 00    Status of Unit ALLOCATED    Transfusion Status OK TO TRANSFUSE    Unit Number [id number]    Blood Component Type THAWED PLASMA    Unit division 00    Status of Unit ALLOCATED    Transfusion Status OK TO TRANSFUSE    Unit Number [id number]    Blood Component Type THAWED PLASMA    Unit division 00    Status of Unit ALLOCATED    Transfusion Status OK TO TRANSFUSE    Unit Number [id number]    Blood Component Type THWPLS APHR1    Unit division 00    Status of Unit  ALLOCATED    Transfusion Status OK TO TRANSFUSE    Ct Abdomen Pelvis Wo Contrast  12/18/2014   CLINICAL DATA:  Generalized abdominal pain, nausea, vomiting, weakness, hypertension, asthma  EXAM: CT ABDOMEN AND PELVIS WITHOUT CONTRAST  TECHNIQUE: Multidetector CT imaging of the abdomen and pelvis was performed following the standard protocol without IV contrast.  COMPARISON:  12/10/2011  FINDINGS: Small bibasilar pleural effusions and atelectasis.  Diffuse fatty infiltration and mild enlargement of liver.  Gallbladder surgically absent.  Streak artifacts traverse upper abdomen from patient's arms.  Small nonobstructing calculus inferior pole LEFT kidney.  No other focal abnormalities of the liver, spleen, pancreas, kidneys, or adrenal glands.  Normal appearance of uterus, adnexa and ureters.  Bladder decompressed.  Normal appendix.  Minimal sigmoid diverticulosis.  Few air-filled mildly prominent small bowel loops in the anterior abdomen without definite evidence of bowel obstruction.  No mass, adenopathy, free air or free fluid.  Bones unremarkable.  IMPRESSION: Bibasilar small pleural effusions and atelectasis.  Diffuse fatty infiltration enlargement of liver.  Minimal sigmoid diverticulosis.  No definite acute intra-abdominal or intrapelvic abnormalities.   Electronically Signed   By: Lavonia Dana M.D.   On: 12/29/2014 03:35   Dg Chest Port 1 View  12/06/2014   CLINICAL DATA:  Central line placement.  Initial encounter.  EXAM: PORTABLE CHEST - 1 VIEW  COMPARISON:  Earlier the same date.  FINDINGS: 1159 hours. Patient is rotated to the right. The endotracheal tube is unchanged within the mid trachea. Nasogastric tube projects below the diaphragm. There is a new left IJ central venous catheter, the tip of which is not well visualized due to overlap with the nasogastric tube. It appears to extend to the mid right atrial level. No evidence of pneumothorax. Bibasilar atelectasis appears about the same. The heart  size and mediastinal contours are stable.  IMPRESSION: Central line tip not well visualized, but appears to extend to the mid right atrial level. No pneumothorax.   Electronically Signed   By: Richardean Sale M.D.   On: 12/09/2014 12:07   Dg Chest Portable 1 View  12/01/2014   CLINICAL DATA:  Assess endotracheal tube after intubation.  EXAM: PORTABLE CHEST - 1 VIEW  COMPARISON:  Earlier the same day  FINDINGS: Endotracheal tube with tip just below the clavicular heads. The orogastric tube is difficult to visualize separate from the multiple wires, overlaps the stomach at least.  Worsening hypoventilation with streaky  perihilar and basilar opacity. No edema, effusion, or air leak. Stable normal heart size for technique.  IMPRESSION: 1. New endotracheal and orogastric tubes are in good position. 2. Unchanged hypoventilation with bilateral atelectasis. Cannot exclude superimposed bronchopneumonia.   Electronically Signed   By: Monte Fantasia M.D.   On: 12/26/2014 04:22   Dg Chest Port 1 View  12/12/2014   CLINICAL DATA:  Hypertension, achy level failure, constant ongoing generalized weakness, nausea, shortness of breath, hypertension, asthma  EXAM: PORTABLE CHEST - 1 VIEW  COMPARISON:  Portable exam 0209 hr compared to 12/10/2011  FINDINGS: Normal heart size, mediastinal contours and pulmonary vascularity.  Elevation of RIGHT diaphragm with RIGHT basilar atelectasis.  Upper lungs clear.  No pleural effusion or pneumothorax.  IMPRESSION: Elevation of RIGHT diaphragm with RIGHT basilar atelectasis.   Electronically Signed   By: Lavonia Dana M.D.   On: 12/19/2014 03:14      Assessment/Plan Septic shock with acute metabolic encephalopathy, presumably from an abdominal source -The patient is critically ill with unknown source of sepsis.  Cultures are all still pending.  CXR unimpressive for source.  GI complaints on admission including abdominal pain.  Still has pain despite sedation on vent.  Has MSOF.    -Continue resuscitation efforts, already received 4 units of FFP for coagulopathy.   -Will discuss with Dr. Redmond Pulling about consideration of surgical interventions.  Abdominal pain, acute on chronic N/V/D Cardiac arrest Elevated troponin > suspect demand ischemia Hepatic failure - cirrhosis Acute Kidney Injury > suspect pre-renal   HTN ETOH abuse    Mila Palmer, [facility] Surgery 12/07/2014, 12:15 PM Pager: Physician Assistant Student, [employer]

## 2014-12-21 NOTE — ED Notes (Signed)
Per EMS, the patient is from home with complaints of feeling weak over the last few days. 22g in left hand, already received of NS, bp reading 72 systolic en route, with no o2 saturation on room air. Placed on 2L, reads 100%. patient was nauseous en route, given  of zofran, apprears jaundice, and patient reports acute liver failure in history. Right eye occasionally drifts as "part of my cerebral palsy". Pt states she drinks alcohol weekly.

## 2014-12-21 NOTE — ED Notes (Signed)
MD aware of lactic acid result. 

## 2014-12-21 NOTE — Procedures (Signed)
Limited echo Post code  1. Heart contraction noted, poor images, globaly down, small LV cavity  [name]. [name], MD, FACP Pgr: [phone] [place]

## 2014-12-21 NOTE — Progress Notes (Signed)
Spoke with husband, Weston Brass, and updated. Explained severity of situation and post-code status. Will continue to monitor.

## 2014-12-21 NOTE — ED Notes (Signed)
Reported cbg to dr. Elesa Massed. Verbal for 1 amp of dextrose, awaiting CCM.

## 2014-12-21 NOTE — ED Notes (Signed)
1 epi given 

## 2014-12-21 NOTE — ED Notes (Signed)
Levophed drip increased to 5 mcg/min

## 2014-12-21 NOTE — Care Management Note (Signed)
Case Management Note  Patient Details  Name: [name] [name] MRN: [id number] Date of Birth: 23-Nov-1970  Subjective/Objective:      Has husband who she is separated from but not legally divorced.  Is in [place] at this time due a death in the family.                Action/Plan:   Expected Discharge Date:                  Expected Discharge Plan:  Home/Self Care  In-House Referral:     Discharge planning Services  CM Consult  Post Acute Care Choice:    Choice offered to:     DME Arranged:    DME Agency:     HH Arranged:    HH Agency:     Status of Service:  In process, will continue to follow  Medicare Important Message Given:    Date Medicare IM Given:    Medicare IM give by:    Date Additional Medicare IM Given:    Additional Medicare Important Message give by:     If discussed at Long Length of Stay Meetings, dates discussed:    Additional Comments:  Vangie Bicker, RN 12/30/2014, 2:11 PM

## 2014-12-21 NOTE — Procedures (Signed)
Arterial Catheter Insertion Procedure Note LAKENZIE MCCLAFFERTY [id number] 1970-12-27  Procedure: Insertion of Arterial Catheter  Indications: Blood pressure monitoring and Frequent blood sampling  Procedure Details Consent: Unable to obtain consent because of emergent medical necessity. Time Out: Verified patient identification, verified procedure, site/side was marked, verified correct patient position, special equipment/implants available, medications/allergies/relevent history reviewed, required imaging and test results available.  Performed  Maximum sterile technique was used including antiseptics, cap, gloves, gown, hand hygiene, mask and sheet. Skin prep: Chlorhexidine; local anesthetic administered 20 gauge catheter was inserted into right femoral artery using the Seldinger technique.  Evaluation Blood flow good; BP tracing poor. Complications: No apparent complications.   [name]. 12/05/2014  Placed aroubd 1 pm Refractory shock Failed radials After placement occluded, removed [name]. [name], MD, FACP Pgr: [phone] [facility]

## 2014-12-21 NOTE — Progress Notes (Signed)
Error   09-Jan-2015 10:06 AM

## 2014-12-21 NOTE — Procedures (Signed)
ACL  Coded with max support Epi 20, levo 80 etc  Refractory mods, lactic acidosis abdo concern  ACLS followed, resusciated after 2 rounds to SR  Spoke to husband.  We discussed patients current circumstances and organ failures.  Family has decided to NOT perform resuscitation if arrest but to continue current medical support for now.  [name]. [name], MD, FACP Pgr: [phone] [place] [facility]

## 2014-12-21 NOTE — ED Notes (Signed)
2nd ffp insfusing.

## 2014-12-21 NOTE — Procedures (Signed)
Intubation Procedure Note [name] [name] [id number] 1970-05-19  Procedure: Intubation Indications: Airway protection and maintenance  Procedure Details Consent: Risks of procedure as well as the alternatives and risks of each were explained to the (patient/caregiver).  Consent for procedure obtained. Time Out: Verified patient identification, verified procedure, site/side was marked, verified correct patient position, special equipment/implants available, medications/allergies/relevent history reviewed, required imaging and test results available.  Performed  Maximum sterile technique was used including antiseptics, cap, gloves, gown, hand hygiene and mask.  Miller and 3    Evaluation Hemodynamic Status: BP stable throughout; O2 sats: stable throughout Patient's Current Condition: stable Complications: No apparent complications Patient did tolerate procedure well. Chest X-ray ordered to verify placement.  CXR: pending.   [name] [name] [facility] 12/24/2014

## 2014-12-21 NOTE — Progress Notes (Signed)
Utilization Review Completed.[name], [name] T9/21/2016  

## 2014-12-21 NOTE — Progress Notes (Signed)
Dr Lowell Guitar notified of PEA arrest. Notified CRRT stopped for now. Will monitor closely and update as needed.

## 2014-12-21 NOTE — Procedures (Signed)
Central Venous Catheter Insertion Procedure Note [name] [name] [id number] 05-07-1970  Procedure: Insertion of Central Venous Catheter Indications: cvvhd  Procedure Details Consent: Risks of procedure as well as the alternatives and risks of each were explained to the (patient/caregiver).  Consent for procedure obtained. Time Out: Verified patient identification, verified procedure, site/side was marked, verified correct patient position, special equipment/implants available, medications/allergies/relevent history reviewed, required imaging and test results available.  Performed  Maximum sterile technique was used including antiseptics, cap, gloves, gown, hand hygiene, mask and sheet. Skin prep: Chlorhexidine; local anesthetic administered A antimicrobial bonded/coated triple lumen catheter was placed in the right internal jugular vein using the Seldinger technique.  Evaluation Blood flow good Complications: No apparent complications Patient did tolerate procedure well. Chest X-ray ordered to verify placement.  CXR: pending.  [name] [name] 12-23-14, 12:50 PM   Emergent need [name] [name]. [name] Alias, MD, FACP Pgr: [phone] [place] [facility]

## 2014-12-21 NOTE — ED Notes (Signed)
1st bag of ffp given.

## 2014-12-21 NOTE — Consult Note (Signed)
[facility] CONSULT NOTE    Date: 12/10/2014                  Patient Name:  [name] [name]  MRN: [id number]  DOB: June 02, 1970  Age / Sex: 44 y.o., female         PCP: Lolita Patella, MD                 Service Requesting Consult: CCM                 Reason for Consult:  Acute kidney injury            History of Present Illness: [name] is a 44 y.o. woman with a PMHx of HTN, asthma, fatty liver disease, and alcohol abuse who was admitted to [facility] on 12/20/2014 for evaluation of weakness found to be hypotensive (40-80s systolic) and deteriorated suffering a brief PEA arrest. No family available to obtain history. Patient suspected to have septic shock with an abdominal source and hepatic failure and thus admitted to ICU. Currently on 3 pressors, maintaining BPs in 60s-90s systolic. Lactic acid 7.75 and acidotic with pH 7.14, pC02 20, AG 25. Patient noted to have a Cr of 12.4 on admission and after IVF administration (4 L NS) decreased to 9.5. Last Cr on record is 0.68 which was in 2013. Patient currently anuric. Most recent electrolytes show Na 129, K 3.3, bicarb 7. UA with large Hgb, 3+ protein, WBC 3-6, RBC 3-6, many bacteria, and granular casts.    Medications: Outpatient medications: Prescriptions prior to admission  Medication Sig Dispense Refill Last Dose  . albuterol (VENTOLIN HFA) 108 (90 BASE) MCG/ACT inhaler Inhale 2 puffs into the lungs every 6 (six) hours as needed. For shortness of breath   Taking  . ALPRAZolam (XANAX) 1 MG tablet Take 1-2 mg by mouth 3 (three) times daily as needed. For anxiety   Taking  . amLODipine (NORVASC) 5 MG tablet Take 5 mg by mouth daily.   Taking  . Ascorbic Acid (VITAMIN C) 100 MG tablet Take 100 mg by mouth daily.   Taking  . budesonide-formoterol (SYMBICORT) 160-4.5 MCG/ACT inhaler Inhale 2 puffs into the lungs 2 (two) times daily.   Taking  . cetirizine (ZYRTEC) 10 MG tablet Take 10 mg by mouth daily.   Taking  .  Coenzyme Q10 (CO Q 10 PO) Take 1 capsule by mouth daily.   Taking  . cyclobenzaprine (FLEXERIL) 10 MG tablet Take 10 mg by mouth 3 (three) times daily as needed. For pain   Taking  . cycloSPORINE (RESTASIS) 0.05 % ophthalmic emulsion Place 1 drop into both eyes 2 (two) times daily.   Taking  . eletriptan (RELPAX) 40 MG tablet One tablet by mouth at onset of headache. May repeat in 2 hours if headache persists or recurs. may repeat in 2 hours if necessary   Taking  . escitalopram (LEXAPRO) 10 MG tablet Take 10 mg by mouth daily.   Taking  . famotidine (PEPCID) 20 MG tablet TAKE ONE TABLET BY MOUTH AT BEDTIME 30 tablet 2   . ibuprofen (ADVIL,MOTRIN) 800 MG tablet Take 800 mg by mouth every 8 (eight) hours as needed.   Taking  . losartan (COZAAR) 100 MG tablet Take 100 mg by mouth daily.     . Multiple Vitamins-Minerals (WOMENS MULTI PO) Take 1 tablet by mouth daily.   Taking  . nitrofurantoin (MACRODANTIN) 100 MG capsule Take 100 mg by mouth 2 (two) times  daily.   Taking  . Olopatadine HCl (PATADAY) 0.2 % SOLN Place 1 drop into both eyes daily.    Taking  . omeprazole (PRILOSEC) 20 MG capsule Take 20 mg by mouth daily.   Taking  . pantoprazole (PROTONIX) 40 MG tablet Take 40 mg by mouth daily.   Taking  . Potassium 99 MG TABS Take 1 tablet by mouth daily.   Taking  . Probiotic Product (PROBIOTIC DAILY PO) Take 1 capsule by mouth daily.   Taking  . SUMAtriptan Succinate (SUMAVEL DOSEPRO ) Inject 1 mL into the skin daily as needed. For severe migraine   Taking  . thyroid (ARMOUR) 30 MG tablet Take 30 mg by mouth daily.   Taking  . Topiramate (TOPAMAX PO) Take 75 mg by mouth daily.   Taking  . traZODone (DESYREL) 50 MG tablet Take 50 mg by mouth at bedtime as needed. For insomnia   Taking    Current medications: Current Facility-Administered Medications  Medication Dose Route Frequency Provider Last Rate Last Dose  . 0.9 %  sodium chloride infusion   Intravenous Continuous Kristen N Ward, DO       . 0.9 %  sodium chloride infusion   Intravenous Once Duayne Cal, NP      . 0.9 %  sodium chloride infusion  250 mL Intravenous PRN Duayne Cal, NP      . 0.9 %  sodium chloride infusion   Intravenous Continuous Duayne Cal, NP 75 mL/hr at 12/10/2014 0645    . 0.9 %  sodium chloride infusion   Intravenous Once Courtney Paris, MD      . albuterol (PROVENTIL) (2.5 MG/3ML) 0.083% nebulizer solution 2.5 mg  2.5 mg Nebulization Q3H PRN Duayne Cal, NP      . Melene Muller ON 01-07-2015] anidulafungin (ERAXIS) 100 mg in sodium chloride 0.9 % 100 mL IVPB  100 mg Intravenous Q24H [name], MD      . anidulafungin (ERAXIS) 200 mg in sodium chloride 0.9 % 200 mL IVPB  200 mg Intravenous Once [name], MD      . Melene Muller ON 01/07/15] cefTAZidime (FORTAZ) 1 g in dextrose 5 % 50 mL IVPB  1 g Intravenous Q48H Titus Mould, RPH      . EPINEPHrine (ADRENALIN) 4 mg in dextrose 5 % 250 mL (0.016 mg/mL) infusion  0.5-20 mcg/min Intravenous Titrated [name], MD 13.1 mL/hr at 12/06/2014 1110 3.5 mcg/min at 12/07/2014 1110  . etomidate (AMIDATE) 2 MG/ML injection           . fentaNYL (SUBLIMAZE) 2,500 mcg in sodium chloride 0.9 % 250 mL (10 mcg/mL) infusion  25-400 mcg/hr Intravenous Continuous Duayne Cal, NP 5 mL/hr at 12/29/2014 0604 50 mcg/hr at 12/05/2014 0604  . fentaNYL (SUBLIMAZE) bolus via infusion 50 mcg  50 mcg Intravenous Q1H PRN Duayne Cal, NP      . fentaNYL (SUBLIMAZE) injection 50 mcg  50 mcg Intravenous Once Duayne Cal, NP      . hydrocortisone sodium succinate (SOLU-CORTEF) 100 MG injection 50 mg  50 mg Intravenous Q6H [name], MD      . insulin aspart (novoLOG) injection 2-6 Units  2-6 Units Subcutaneous 6 times per day Duayne Cal, NP   6 Units at 12/29/2014 0857  . ipratropium-albuterol (DUONEB) 0.5-2.5 (3) MG/3ML nebulizer solution 3 mL  3 mL Nebulization Q6H Duayne Cal, NP   3 mL at 12/23/2014 0802  .  lactulose (CHRONULAC) 10 GM/15ML solution 30 g   30 g Per Tube BID Duayne Cal, NP      . lidocaine (cardiac) 100 mg/47ml (XYLOCAINE) 20 MG/ML injection 2%           . metroNIDAZOLE (FLAGYL) IVPB 500 mg  500 mg Intravenous Q8H [name], MD      . midazolam (VERSED) injection 2 mg  2 mg Intravenous Q15 min PRN Duayne Cal, NP      . midazolam (VERSED) injection 2 mg  2 mg Intravenous Q2H PRN Duayne Cal, NP      . norepinephrine (LEVOPHED) 16 mg in dextrose 5 % 250 mL (0.064 mg/mL) infusion  0-40 mcg/min Intravenous Titrated Alyson Reedy, MD 75 mL/hr at 01-08-15 1032 80 mcg/min at 08-Jan-2015 1032  . pantoprazole (PROTONIX) injection 40 mg  40 mg Intravenous QHS Duayne Cal, NP      . phytonadione (VITAMIN K) 10 mg in dextrose 5 % 50 mL IVPB  10 mg Intravenous Once [name], MD      . potassium chloride 10 mEq in 100 mL IVPB  10 mEq Intravenous Q1 Hr x 2 [name], MD      . [facility]) 50 MG/5ML injection           . sodium bicarbonate 150 mEq in dextrose 5 % 1,000 mL infusion   Intravenous Continuous [name], MD 75 mL/hr at Jan 08, 2015 [phone]    . succinylcholine (ANECTINE) 20 MG/ML injection           . THROMBI-PAD (THROMBI-PAD) 3"X3" pad 1 each  1 each Topical Once Rahul P Desai, PA-C      . [START ON 12/23/2014] vancomycin (VANCOCIN) IVPB 1000 mg/200 mL premix  1,000 mg Intravenous Q48H Titus Mould, RPH      . vasopressin (PITRESSIN) 40 Units in sodium chloride 0.9 % 250 mL (0.16 Units/mL) infusion  0.03 Units/min Intravenous Continuous Alyson Reedy, MD 11.3 mL/hr at 01/08/15 0640 0.03 Units/min at 2015-01-08 0640      Allergies: Allergies  Allergen Reactions  . Amoxicillin Anhydrous [Amoxicillin] Hives  . Ciprofloxacin Hives      Past Medical History: Past Medical History  Diagnosis Date  . Hypertension   . Asthma   . Migraines   . Acute renal failure   . Hypothyroid      Past Surgical History: Past Surgical History  Procedure Laterality Date  . Cholecystectomy     . Knee surgery       Family History: Family History  Problem Relation Age of Onset  . Allergies Maternal Grandmother   . Allergies Paternal Grandmother   . Asthma Paternal Grandmother   . Asthma Sister   . Heart disease Paternal Grandfather   . Clotting disorder Paternal Grandfather   . Lung cancer Paternal Grandfather     smoked and had exposure to chemicals  . Thyroid cancer Maternal Aunt      Social History: Social History   Social History  . Marital Status: Married    Spouse Name: N/A  . Number of Children: 0  . Years of Education: N/A   Occupational History  . Self employed    Social History Main Topics  . Smoking status: Never Smoker   . Smokeless tobacco: Never Used  . Alcohol Use: Yes     Comment: occ  . Drug Use: No  . Sexual Activity: Not on file   Other Topics Concern  .  Not on file   Social History Narrative     Review of Systems: Unable to complete ROS as patient is intubated and sedated  Vital Signs: Blood pressure 41/29, pulse 32, temperature 95.1 F (35.1 C), temperature source Rectal, resp. rate 23, height  (1.626 m), weight 177 lb 14.6 oz (80.7 kg), SpO2 95 %.  Weight trends: Filed Weights   12/02/2014 0132 12/26/2014 0615  Weight: 155 lb (70.308 kg) 177 lb 14.6 oz (80.7 kg)    Physical Exam: General: obese woman sedated on vent HEENT: [place]/AT, R pupil larger than L pupil, ETT in place CV: RRR, no m/g/r Pulm: coarse breath sounds bilaterally Abd: hypoactive BS, soft, non-distended Ext: cool extremities, mottled skin Neuro: sedated on vent  Lab results: Basic Metabolic Panel:  Recent Labs Lab 12/02/2014 0145 12/12/2014 0214 12/17/2014 0545 12/29/2014 0642  NA 127* 127* 130* 129*  K 3.5 3.5 3.4* 3.3*  CL 91* 96* 98* 97*  CO2 7*  --  7* 7*  GLUCOSE 74 69 176* 210*  BUN 47* 42* 37* 37*  CREATININE 12.13* 12.40* 9.47* 9.50*  CALCIUM 6.5*  --  5.2* 5.4*  MG  --   --  2.4  --   PHOS  --   --  10.1*  --     Liver Function  Tests:  Recent Labs Lab 12/07/2014 0145  AST 1584*  ALT 194*  ALKPHOS 704*  BILITOT 5.6*  PROT 5.2*  ALBUMIN 1.7*    Recent Labs Lab 12/16/2014 0145 12/20/2014 0545  LIPASE 44  --   AMYLASE  --  64    Recent Labs Lab 12/18/2014 0145  AMMONIA 83*    CBC:  Recent Labs Lab 12/12/2014 0145 12/30/2014 0214 12/26/2014 0642  WBC 24.8*  --  26.9*  NEUTROABS 21.7*  --   --   HGB 10.9* 13.6 8.8*  HCT 32.2* 40.0 25.2*  MCV 108.8*  --  110.5*  PLT 156  --  120*    Cardiac Enzymes:  Recent Labs Lab 12/01/2014 0545  TROPONINI 0.41*    BNP: Invalid input(s): POCBNP  CBG:  Recent Labs Lab 12/30/2014 0316 12/17/2014 0343 12/18/2014 0755  GLUCAP 64* 147* 232*    Microbiology: Results for orders placed or performed during the hospital encounter of 12/20/2014  MRSA PCR Screening     Status: None   Collection Time: 12/23/2014  6:58 AM  Result Value Ref Range Status   MRSA by PCR NEGATIVE NEGATIVE Final    Comment:        The GeneXpert MRSA Assay (FDA approved for NASAL specimens only), is one component of a comprehensive MRSA colonization surveillance program. It is not intended to diagnose MRSA infection nor to guide or monitor treatment for MRSA infections.     Coagulation Studies:  Recent Labs  12/05/2014 0145 12/12/2014 0646  LABPROT 40.5* 31.1*  INR 4.35* 3.07*    Urinalysis:  Recent Labs  12/26/2014 0154  COLORURINE GREEN*  LABSPEC 1.025  PHURINE 6.0  GLUCOSEU 500*  HGBUR LARGE*  BILIRUBINUR SMALL*  KETONESUR NEGATIVE  PROTEINUR >300*  UROBILINOGEN 0.2  NITRITE NEGATIVE  LEUKOCYTESUR NEGATIVE      Imaging: Ct Abdomen Pelvis Wo Contrast  12/19/2014   CLINICAL DATA:  Generalized abdominal pain, nausea, vomiting, weakness, hypertension, asthma  EXAM: CT ABDOMEN AND PELVIS WITHOUT CONTRAST  TECHNIQUE: Multidetector CT imaging of the abdomen and pelvis was performed following the standard protocol without IV contrast.  COMPARISON:  12/10/2011  FINDINGS:  Small bibasilar  pleural effusions and atelectasis.  Diffuse fatty infiltration and mild enlargement of liver.  Gallbladder surgically absent.  Streak artifacts traverse upper abdomen from patient's arms.  Small nonobstructing calculus inferior pole LEFT kidney.  No other focal abnormalities of the liver, spleen, pancreas, kidneys, or adrenal glands.  Normal appearance of uterus, adnexa and ureters.  Bladder decompressed.  Normal appendix.  Minimal sigmoid diverticulosis.  Few air-filled mildly prominent small bowel loops in the anterior abdomen without definite evidence of bowel obstruction.  No mass, adenopathy, free air or free fluid.  Bones unremarkable.  IMPRESSION: Bibasilar small pleural effusions and atelectasis.  Diffuse fatty infiltration enlargement of liver.  Minimal sigmoid diverticulosis.  No definite acute intra-abdominal or intrapelvic abnormalities.   Electronically Signed   By: Ulyses Southward M.D.   On: 12/16/2014 03:35   Dg Chest Portable 1 View  12/01/2014   CLINICAL DATA:  Assess endotracheal tube after intubation.  EXAM: PORTABLE CHEST - 1 VIEW  COMPARISON:  Earlier the same day  FINDINGS: Endotracheal tube with tip just below the clavicular heads. The orogastric tube is difficult to visualize separate from the multiple wires, overlaps the stomach at least.  Worsening hypoventilation with streaky perihilar and basilar opacity. No edema, effusion, or air leak. Stable normal heart size for technique.  IMPRESSION: 1. New endotracheal and orogastric tubes are in good position. 2. Unchanged hypoventilation with bilateral atelectasis. Cannot exclude superimposed bronchopneumonia.   Electronically Signed   By: Marnee Spring M.D.   On: 12/08/2014 04:22   Dg Chest Port 1 View  12/11/2014   CLINICAL DATA:  Hypertension, achy level failure, constant ongoing generalized weakness, nausea, shortness of breath, hypertension, asthma  EXAM: PORTABLE CHEST - 1 VIEW  COMPARISON:  Portable exam 0209 hr  compared to 12/10/2011  FINDINGS: Normal heart size, mediastinal contours and pulmonary vascularity.  Elevation of RIGHT diaphragm with RIGHT basilar atelectasis.  Upper lungs clear.  No pleural effusion or pneumothorax.  IMPRESSION: Elevation of RIGHT diaphragm with RIGHT basilar atelectasis.   Electronically Signed   By: Ulyses Southward M.D.   On: 12/03/2014 03:14      Assessment & Plan: Pt is a 44 y.o. yo woman with a PMHX of HTN, asthma, fatty liver disease, and alcohol abuse admitted to the ICU with septic shock due to likely abdominal source with multiorgan failure.   1. Acute Renal Failure: Likely secondary to septic shock with multiorgan failure. Question whether patient possibly ingested something given alcohol history and green urine on UA (methylene blue?). Cr 12.5 on admission, now 9.5 after 4 L NS. No prior history of kidney disease. Patient is anuric. Will start CRRT. Overall prognosis poor.   2. Septic Shock with MODS: Patient with altered mental status, hypotension, hypothermia, tachypnia, and leukocytosis of 24,000 on admission with an elevated lactic acid of 7.7 (now increased to 12) consistent with septic shock. Abdominal source suspected, ischemic bowel?. Patient with cardiac arrest on admission and with liver and renal failure. Blood cultures and urine culture pending. Unable to do paracentesis as no fluid in abdomen, no SBP. Currently on Ceftazadime, Flagyl, and Vancomycin. Patient is currently requiring 3 pressors to maintain BP in 60-90s systolic.   3. Hypokalemia: K 3.3 in setting of acute renal failure. Will have dialysate fluid with K.   4. Hyponatremia: Na 127 on admission, now 129. Unclear if has some chronic hyponatremia secondary to alcohol abuse since last labs in 2013. Currently on NS at 125 ml/hr.   5. Hypocalcemia: Corrected Ca  7.2. Ionized Ca 0.74. Due to sepsis.   6. Anion Gap Metabolic Acidosis: AG 29 on admission, most recent 25. Bicarb 7. Due to lactic acidosis.  Currently on sodium bicarbonate drip at 50 ml/hr. Once CRRT starts will increase to 150 ml/hr.   7. Macrocytic Anemia: Hgb 10.9 on admission, currently 8.8. MCV 108. Likely due to liver disease and underlying folic acid deficiency from alcohol use. Transfuse for Hgb < 7.0.   8. Coagulopathy Due to Sepsis/Hepatic Failure: Platelets 120,000, INR supratherapeutic at 3.07. Likely secondary to sepsis and liver failure.    DVT PPX: SCDs  Thank you for this interesting consult. Attending note to follow.   Rich Number, MD, MPH Internal Medicine Resident, PGY-II Pager: [phone]

## 2014-12-21 NOTE — ED Provider Notes (Signed)
This chart was scribed for [name] Maw Ward, DO by Arlan Organ, ED Scribe. This patient was seen in room A13C/A13C and the patient's care was started 1:37 AM.   TIME SEEN: 1:38 AM   CHIEF COMPLAINT:  Chief Complaint  Patient presents with  . Weakness     HPI: HPI Comments: [name] [name] brought in by EMS is a 44 y.o. female with a PMHx of HTN and alcoholic cirrhosis who presents to the Emergency Department complaining of constant, ongoing generalized weakness onset just prior to arrival. Pt also reports several days of vomiting, diarrhea. She also states she does feel short of breath.. 4 mg of Zofran given en route to department. No recent fever, chills, chest pain. States she does have abdominal pain which is chronic. Denies any noted black or tarry stools. [name] admits to consuming alcohol every day. Last drink 3 days ago. Patient was hypotensive with EMS but this is improved with IV fluids.  Patient reports she is a full code  ROS: See HPI Constitutional: no fever  Eyes: no drainage  ENT: no runny nose   Cardiovascular:  no chest pain  Resp: Positive SOB  GI: no vomiting. positive nausea GU: no dysuria Integumentary: no rash  Allergy: no hives  Musculoskeletal: no leg swelling  Neurological: no slurred speech. Positive weakness  ROS otherwise negative  PAST MEDICAL HISTORY/PAST SURGICAL HISTORY:  Past Medical History  Diagnosis Date  . Hypertension   . Asthma   . Migraines   . Acute renal failure   . Hypothyroid     MEDICATIONS:  Prior to Admission medications   Medication Sig Start Date End Date Taking? Authorizing Provider  albuterol (VENTOLIN HFA) 108 (90 BASE) MCG/ACT inhaler Inhale 2 puffs into the lungs every 6 (six) hours as needed. For shortness of breath    Historical Provider, MD  ALPRAZolam Prudy Feeler) 1 MG tablet Take 1-2 mg by mouth 3 (three) times daily as needed. For anxiety    Historical Provider, MD  amLODipine (NORVASC) 5 MG tablet Take 5 mg by  mouth daily.    Historical Provider, MD  Ascorbic Acid (VITAMIN C) 100 MG tablet Take 100 mg by mouth daily.    Historical Provider, MD  budesonide-formoterol (SYMBICORT) 160-4.5 MCG/ACT inhaler Inhale 2 puffs into the lungs 2 (two) times daily.    Historical Provider, MD  cetirizine (ZYRTEC) 10 MG tablet Take 10 mg by mouth daily.    Historical Provider, MD  Coenzyme Q10 (CO Q 10 PO) Take 1 capsule by mouth daily.    Historical Provider, MD  cyclobenzaprine (FLEXERIL) 10 MG tablet Take 10 mg by mouth 3 (three) times daily as needed. For pain    Historical Provider, MD  cycloSPORINE (RESTASIS) 0.05 % ophthalmic emulsion Place 1 drop into both eyes 2 (two) times daily.    Historical Provider, MD  eletriptan (RELPAX) 40 MG tablet One tablet by mouth at onset of headache. May repeat in 2 hours if headache persists or recurs. may repeat in 2 hours if necessary    Historical Provider, MD  escitalopram (LEXAPRO) 10 MG tablet Take 10 mg by mouth daily.    Historical Provider, MD  famotidine (PEPCID) 20 MG tablet TAKE ONE TABLET BY MOUTH AT BEDTIME 11/16/12   [name], MD  ibuprofen (ADVIL,MOTRIN) 800 MG tablet Take 800 mg by mouth every 8 (eight) hours as needed.    Historical Provider, MD  losartan (COZAAR) 100 MG tablet Take 100 mg by mouth daily.  Historical Provider, MD  Multiple Vitamins-Minerals (WOMENS MULTI PO) Take 1 tablet by mouth daily.    Historical Provider, MD  nitrofurantoin (MACRODANTIN) 100 MG capsule Take 100 mg by mouth 2 (two) times daily.    Historical Provider, MD  Olopatadine HCl (PATADAY) 0.2 % SOLN Place 1 drop into both eyes daily.     Historical Provider, MD  omeprazole (PRILOSEC) 20 MG capsule Take 20 mg by mouth daily.    Historical Provider, MD  pantoprazole (PROTONIX) 40 MG tablet Take 40 mg by mouth daily.    Historical Provider, MD  Potassium 99 MG TABS Take 1 tablet by mouth daily.    Historical Provider, MD  Probiotic Product (PROBIOTIC DAILY PO) Take 1 capsule  by mouth daily.    Historical Provider, MD  SUMAtriptan Succinate (SUMAVEL DOSEPRO Carmine) Inject 1 mL into the skin daily as needed. For severe migraine    Historical Provider, MD  thyroid (ARMOUR) 30 MG tablet Take 30 mg by mouth daily.    Historical Provider, MD  Topiramate (TOPAMAX PO) Take 75 mg by mouth daily.    Historical Provider, MD  traZODone (DESYREL) 50 MG tablet Take 50 mg by mouth at bedtime as needed. For insomnia    Historical Provider, MD    ALLERGIES:  Allergies  Allergen Reactions  . Amoxicillin Anhydrous [Amoxicillin] Hives  . Ciprofloxacin Hives    SOCIAL HISTORY:  Social History  Substance Use Topics  . Smoking status: Never Smoker   . Smokeless tobacco: Never Used  . Alcohol Use: Yes     Comment: occ    FAMILY HISTORY: Family History  Problem Relation Age of Onset  . Allergies Maternal Grandmother   . Allergies Paternal Grandmother   . Asthma Paternal Grandmother   . Asthma Sister   . Heart disease Paternal Grandfather   . Clotting disorder Paternal Grandfather   . Lung cancer Paternal Grandfather     smoked and had exposure to chemicals  . Thyroid cancer Maternal Aunt     EXAM: BP 103/79 mmHg  Pulse 99  Temp(Src) 97.4 F (36.3 C) (Axillary)  Resp 40  Ht  (1.626 m)  Wt 155 lb (70.308 kg)  BMI 26.59 kg/m2 CONSTITUTIONAL: Alert and oriented and responds appropriately to questions. Chronically ill appearing, difficult to understand due to dry mucous membranes HEAD: Normocephalic EYES: Sclera icterus noted bilaterally, PERRL, no conjunctival injection ENT: normal nose; no rhinorrhea; dry mucous membranes; pharynx without lesions noted NECK: Supple, no meningismus, no LAD  CARD: RRR; S1 and S2 appreciated; no murmurs, no clicks, no rubs, no gallops RESP: Normal chest excursion without splinting or tachypnea; breath sounds clear and equal bilaterally; no wheezes, no rhonchi, no rales, no hypoxia or respiratory distress, speaking full  sentences ABD/GI: Normal bowel sounds; distended with ?fluid wave, no tympany ; soft, mild tenderness to palpation diffusely, no rebound, no guarding, no peritoneal signs.  RECTAL EXAM: slightly diminished rectal tone, no melana, no gross blood noted BACK:  The back appears normal and is non-tender to palpation, there is no CVA tenderness EXT: Normal ROM in all joints; non-tender to palpation; no edema; normal capillary refill; no cyanosis, no calf tenderness or swelling    SKIN: Normal color for age and race; warm, patient has jaundice NEURO: Moves all extremities equally, sensation to light touch intact diffusely, cranial nerves II through XII intact. No asterixis  PSYCH: The patient's mood and manner are appropriate. Grooming and personal hygiene are appropriate.   MEDICAL DECISION MAKING:  Patient here with hypotension. This is improved with IV fluids. She is a distended abdomen and his jaundice on exam. Will give IV fluids, obtain labs, urine, chest x-ray, blood cultures. Rectal temperature is 97.6.  ED PROGRESS: Patient has a leukocytosis of 25,000. Lactate is 8. She also has acute renal failure with a creatinine of 12. Troponin also elevated at 0.41 EKG shows no ischemic changes. This is likely demand ischemia in the setting of sepsis. Given broad-spectrum antibiotics and continued IV hydration.  2:46 AM- Discussed with Dr. Sharol Harness with critical care. Critical care will come down to the see the patient. Urine shows no obvious sign of infection. Chest x-ray is clear. Possible source of infection from ascites but not enough fluid to safely perform paracentesis in emergency department.   Called to room for unresponsiveness. Patient found to have shallow breathing and extremely hypotensive. Decision made to intubate patient. Before intubation performed patient arrested. CPR performed and epinephrine, bicarbonate given. Regained pulses after a proximally 5 minutes. 7.5 and a tracheal tube placed.  Chest x-ray confirmed good placement. Critical care at bedside to place central line and start levothyroxine for hypotension.     EKG Interpretation  Date/Time:  Wednesday 2015/01/04 02:14:20 EDT Ventricular Rate:  97 PR Interval:  160 QRS Duration: 62 QT Interval:  394 QTC Calculation: 500 R Axis:   100 Text Interpretation:  Sinus rhythm Anterior infarct, old Nonspecific T abnormalities, lateral leads Confirmed by WARD,  DO, KRISTEN [phone]) on 01/04/15 2:24:04 AM        EKG Interpretation  Date/Time:  Wednesday 04-Jan-2015 03:42:26 EDT Ventricular Rate:  108 PR Interval:  160 QRS Duration: 74 QT Interval:  380 QTC Calculation: 509 R Axis:   113 Text Interpretation:  NSR with baseline motion Paired ventricular premature complexes Low voltage, extremity leads Repol abnrm suggests ischemia, inferior leads Borderline ST elevation, anterior leads Baseline wander in lead(s) V1 Confirmed by WARD,  DO, KRISTEN ([id number]) on 01/04/15 4:33:20 AM       CRITICAL CARE Performed by: Raelyn Number   Total critical care time: 60 minutes  Critical care time was exclusive of separately billable procedures and treating other patients.  Critical care was necessary to treat or prevent imminent or life-threatening deterioration.  Critical care was time spent personally by me on the following activities: development of treatment plan with patient and/or surrogate as well as nursing, discussions with consultants, evaluation of patient's response to treatment, examination of patient, obtaining history from patient or surrogate, ordering and performing treatments and interventions, ordering and review of laboratory studies, ordering and review of radiographic studies, pulse oximetry and re-evaluation of patient's condition.    INTUBATION Performed by: Raelyn Number  Required items: required blood products, implants, devices, and special equipment available Patient identity  confirmed: provided demographic data and hospital-assigned identification number Time out: Immediately prior to procedure a "time out" was called to verify the correct patient, procedure, equipment, support staff and site/side marked as required.  Indications:  Cardiac arrest  Intubation method: 3.0 MAC  Preoxygenation: BVM  Sedatives:  20 mg IVEtomidate Paralytic:  100 mg IVSuccinylcholine  Tube Size:  7.5 cuffed  Post-procedure assessment: chest rise and ETCO2 monitor Breath sounds: equal and absent over the epigastrium Tube secured with: ETT holder Chest x-ray interpreted by radiologist and me.  Chest x-ray findings: endotracheal tube in appropriate position  Patient tolerated the procedure well with no immediate complications.     Cardiopulmonary Resuscitation (CPR) Procedure  Note Directed/Performed by: Raelyn Number I personally directed ancillary staff and/or performed CPR in an effort to regain return of spontaneous circulation and to maintain cardiac, neuro and systemic perfusion.       I personally performed the services described in this documentation, which was scribed in my presence. The recorded information has been reviewed and is accurate.   [name] Maw Ward, DO Jan 04, 2015 [phone]

## 2014-12-21 NOTE — ED Notes (Signed)
20 at teeth placed with 7.5 ETTube placed by Dr. Elesa Massed.

## 2014-12-21 NOTE — ED Notes (Signed)
3rd ffp infusing

## 2014-12-21 NOTE — ED Notes (Signed)
Attempted to call ct

## 2014-12-21 NOTE — ED Notes (Signed)
CT delayed per Renae Fickle, NP. Transporter aware.

## 2014-12-21 NOTE — ED Notes (Signed)
Portable cxr paged to room. Repeat cbg is 147.

## 2014-12-21 NOTE — ED Notes (Signed)
No head CT at this time, preparing to transfer to 84M.

## 2014-12-21 NOTE — ED Notes (Signed)
2nd ffp infusing.

## 2014-12-21 NOTE — ED Notes (Signed)
Dr. Elesa Massed now at the bedside for intubation. CPR intitated.

## 2014-12-21 NOTE — ED Notes (Signed)
Portable x-ray at the bedside.  

## 2014-12-21 NOTE — Procedures (Signed)
Arterial Catheter Insertion Procedure Note PARTICIA STRAHM [id number] Aug 14, 1970  Procedure: Insertion of Arterial Catheter  Indications: Blood pressure monitoring and Frequent blood sampling  Procedure Details Consent: Unable to obtain consent because of altered level of consciousness. Time Out: Verified patient identification, verified procedure, site/side was marked, verified correct patient position, special equipment/implants available, medications/allergies/relevent history reviewed, required imaging and test results available.  Performed  Maximum sterile technique was used including antiseptics, cap, gloves, gown, hand hygiene, mask and sheet. Skin prep: Chlorhexidine; local anesthetic administered 20 gauge catheter was inserted into left radial artery using the Seldinger technique.  Evaluation Blood flow good; BP tracing good. Complications: No apparent complications.   Koren Bound 12/08/2014

## 2014-12-21 NOTE — ED Notes (Signed)
Called pharmacy for vitamin K to be expidited.

## 2014-12-21 NOTE — Progress Notes (Signed)
Updated pt's husband (separated) and sister Lauren via phone at length.  They are both out of state at this time in [place], pts father in law passed away yesterday.  Pt's other sister is en route from [place].  Updated them regarding pt's critical status, ongoing shock despite max efforts and overall poor prognosis.  All questions answered.   Dirk Dress, NP 12/11/2014  3:34 PM

## 2014-12-21 NOTE — ED Notes (Signed)
Renae Fickle, NP, with critical care at bedside, preparing for intubation.

## 2014-12-21 NOTE — ED Notes (Signed)
Levophed drip started at 59mcg/min. Verbal order to titrate for map of 65 from Victor, NP

## 2014-12-21 NOTE — ED Notes (Signed)
cbg of 64

## 2014-12-21 NOTE — ED Notes (Signed)
Paul, np at bedside as no reading for bp, but patient is waking up. of fentanyl and  of versed given. Discussed need for access. Stopped vit k to rapidly infuse ffp's. Map bp reading of 66 when speaking with paul.

## 2014-12-21 NOTE — ED Notes (Signed)
Renae Fickle, NP and respiratory therapist present for aline placement. Verbal order for head ct without

## 2014-12-21 NOTE — Progress Notes (Signed)
Pt was able to pull off safety mitts by herself and pull out her arterial line. Verbal order for wrist restraints given by Dr. [name].

## 2014-12-21 NOTE — Progress Notes (Signed)
eLink Physician-Brief Progress Note PatientOZIE DIMARIAen D Kracke DOB: 03-31-1971 MRN: [id number]   Date of Service  12/06/2014  HPI/Events of Note  Failing aggressive medical therapy with multiple vasopressors, NaHCO3 IV infusion and CVVHD. Suspect worsening acidosis.   eICU Interventions  Will check ABG now.      Intervention Category Major Interventions: Shock - evaluation and management;Sepsis - evaluation and management;Hypotension - evaluation and management  Lenell Antu 12/25/2014, 4:26 PM

## 2014-12-21 NOTE — ED Notes (Signed)
Called in wbc count of 24.8 to Dr. Elesa Massed, requested lactic acid. Code sepsis activated.

## 2014-12-21 NOTE — Progress Notes (Signed)
Pt noted with no heart sounds, no dopplerable pulse, and pupils fixed. Time of death 21:37. Verified by this nurse as well as Hazel Sams RN. Sister Nicki Guadalajara and brother Molli Hazard as well as this nurse at pt's bedside during this whole dying period. Dr. Arsenio Loader made aware.

## 2014-12-21 NOTE — Procedures (Signed)
Central Venous Catheter Insertion Procedure Note TATEM HOLSONBACK [id number] Jan 04, 1971  Procedure: Insertion of Central Venous Catheter Indications: Assessment of intravascular volume, Drug and/or fluid administration and Frequent blood sampling  Procedure Details Consent: Unable to obtain consent because of emergent medical necessity. Time Out: Verified patient identification, verified procedure, site/side was marked, verified correct patient position, special equipment/implants available, medications/allergies/relevent history reviewed, required imaging and test results available.  Performed  Maximum sterile technique was used including antiseptics, cap, gloves, gown, hand hygiene, mask and sheet. Skin prep: Chlorhexidine; local anesthetic administered A antimicrobial bonded/coated triple lumen catheter was placed in the left internal jugular vein using the Seldinger technique.  Evaluation Blood flow good Complications: No apparent complications Patient did tolerate procedure well. Chest X-ray ordered to verify placement.  CXR: pending.  Procedure performed under direct ultrasound guidance for real time vessel cannulation.      Rutherford Guys, [place] [facility] Pager: [phone]  or [phone] 13-Jan-2015, 10:58 AM   [place] Tolerated well Emergent need Need access cvp [name]. [name], MD, FACP Pgr: [phone] [facility]

## 2014-12-21 NOTE — ED Notes (Signed)
Spoke to Dr. Wandra Scot with critical care for emergent release blood products.

## 2014-12-21 NOTE — Progress Notes (Signed)
ANTIBIOTIC CONSULT NOTE - F/U  Pharmacy Consult for Vancomycin and Ceftazidime Indication: sepsis  Allergies  Allergen Reactions  . Amoxicillin Anhydrous [Amoxicillin] Hives  . Ciprofloxacin Hives    Patient Measurements: Height:  (162.6 cm) Weight: 177 lb 14.6 oz (80.7 kg) IBW/kg (Calculated) : 54.7  Vital Signs: Temp: 95.1 F (35.1 C) (09/21 1015) Temp Source: Core (Comment) (09/21 1347) BP: 101/69 mmHg (09/21 1142) Pulse Rate: 98 (09/21 1142) Intake/Output from previous day: 09/20 0701 - 09/21 0700 In: 2289.5 [I.V.:2289.5] Out: -  Intake/Output from this shift: Total I/O In: 230 [Blood:230] Out: -   Labs:  Recent Labs  01/12/15 0145 Jan 12, 2015 0154 01-12-2015 0214 Jan 12, 2015 0545 01-12-2015 0642  WBC 24.8*  --   --   --  26.9*  HGB 10.9*  --  13.6  --  8.8*  PLT 156  --   --   --  120*  LABCREA  --  88.43  --   --   --   CREATININE 12.13*  --  12.40* 9.47* 9.50*   Estimated Creatinine Clearance: 7.8 mL/min (by C-G formula based on Cr of 9.5). No results for input(s): VANCOTROUGH, VANCOPEAK, VANCORANDOM, GENTTROUGH, GENTPEAK, GENTRANDOM, TOBRATROUGH, TOBRAPEAK, TOBRARND, AMIKACINPEAK, AMIKACINTROU, AMIKACIN in the last 72 hours.   Microbiology: Recent Results (from the past 720 hour(s))  MRSA PCR Screening     Status: None   Collection Time: 01/12/15  6:58 AM  Result Value Ref Range Status   MRSA by PCR NEGATIVE NEGATIVE Final    Comment:        The GeneXpert MRSA Assay (FDA approved for NASAL specimens only), is one component of a comprehensive MRSA colonization surveillance program. It is not intended to diagnose MRSA infection nor to guide or monitor treatment for MRSA infections.     Medical History: Past Medical History  Diagnosis Date  . Hypertension   . Asthma   . Migraines   . Acute renal failure   . Hypothyroid    Assessment: 44 y.o. F presents with weakness. Noted to be hypotensive in ED and had brief cardiac arrest. Required  intubation in ED.  LA 7.75. Pt with ARF - SCr up to 12.4 with CrCl < 10 ml/min. WBC elevated to 24.8. Cultures pending.  Now to begin CRRT  Goal of Therapy:  Vancomycin trough level 15-20 mcg/ml  Plan:  Change vancomycin to 750 mg q24 h  Change Ceftazidime to 2gm IV q12h on CRRT Add anidulafungin 200 mg x 1 then 100 mg q24 h Will f/u micro data, pt's clinical condition and renal function  Isaac Bliss, PharmD, BCPS Clinical Pharmacist Pager [phone] 01/12/15 1:54 PM

## 2014-12-21 NOTE — H&P (Signed)
PULMONARY / CRITICAL CARE MEDICINE   Name: [name] [name] MRN: [id number] DOB: 06-24-1970    ADMISSION DATE:  12/17/2014 CONSULTATION DATE:  12/20/2014  REFERRING MD :  EDP  CHIEF COMPLAINT:  Weakness  INITIAL PRESENTATION: 44 year old female with h/o cirrhosis presenting with weakness. Hypotensive initially in ED improved with volume. Deteriorated and had brief cardiac arrest in ED. Intubated. Presumed sepsis with abdominal source. Intubated. PCCM to admit.   STUDIES:  9/21 > small bibasilar effusions, fatty infiltration of liver, diverticulosis. No definite intra-abdominal abnormalities.  SIGNIFICANT EVENTS: 9/21 > admit arrest 9/21>>>refractory shock  SUBJECTIVE: levo at 75 and rising  VITAL SIGNS: Temp:  [94.4 F (34.7 C)-97.6 F (36.4 C)] 95.1 F (35.1 C) (09/21 1015) Pulse Rate:  [28-136] 32 (09/21 0945) Resp:  [16-42] 23 (09/21 1015) BP: (39-118)/(19-88) 41/29 mmHg (09/21 1015) SpO2:  [72 %-100 %] 95 % (09/21 0945) Arterial Line BP: (71-86)/(50-64) 86/64 mmHg (09/21 0800) FiO2 (%):  [50 %-100 %] 50 % (09/21 0814) Weight:  [70.308 kg (155 lb)-80.7 kg (177 lb 14.6 oz)] 80.7 kg (177 lb 14.6 oz) (09/21 0615) HEMODYNAMICS:   VENTILATOR SETTINGS: Vent Mode:  [-] PRVC FiO2 (%):  [50 %-100 %] 50 % Set Rate:  [22 bmp-24 bmp] 24 bmp Vt Set:  [520 mL] 520 mL PEEP:  [5 cmH20] 5 cmH20 Plateau Pressure:  [27 cmH20] 27 cmH20 INTAKE / OUTPUT:  Intake/Output Summary (Last 24 hours) at 12/02/2014 1031 Last data filed at 12/18/2014 0700  Gross per 24 hour  Intake 2289.52 ml  Output      0 ml  Net 2289.52 ml    PHYSICAL EXAMINATION: General:  Obese female in NAD Neuro: obtunded, pupils fixed HEENT:  [place]/AT, no JVD Cardiovascular:  RRR, no MRG Lungs:  Clear bilateral breath sounds Abdomen:  Soft, fluctuant, non-distended, BS are present, tender diffuse, no r/g Musculoskeletal:  No acute deformity Skin:  Mottled   LABS:  CBC  Recent Labs Lab 12/01/2014 0145  12/17/2014 0214 12/10/2014 0642  WBC 24.8*  --  26.9*  HGB 10.9* 13.6 8.8*  HCT 32.2* 40.0 25.2*  PLT 156  --  120*   Coag's  Recent Labs Lab 12/01/2014 0145 12/29/2014 0646  APTT 118*  --   INR 4.35* 3.07*   BMET  Recent Labs Lab 12/25/2014 0145 12/25/2014 0214 12/14/2014 0545 12/20/2014 0642  NA 127* 127* 130* 129*  K 3.5 3.5 3.4* 3.3*  CL 91* 96* 98* 97*  CO2 7*  --  7* 7*  BUN 47* 42* 37* 37*  CREATININE 12.13* 12.40* 9.47* 9.50*  GLUCOSE 74 69 176* 210*   Electrolytes  Recent Labs Lab 12/10/2014 0145 12/04/2014 0545 12/15/2014 0642  CALCIUM 6.5* 5.2* 5.4*  MG  --  2.4  --   PHOS  --  10.1*  --    Sepsis Markers  Recent Labs Lab 12/07/2014 0224 12/11/2014 0646 12/06/2014 0703  LATICACIDVEN 7.75*  --  7.4*  PROCALCITON  --  19.66  --    ABG  Recent Labs Lab 12/26/2014 0409 12/13/2014 0628  PHART 6.971* 7.144*  PCO2ART 30.6* 19.8*  PO2ART 255.0* 105.0*   Liver Enzymes  Recent Labs Lab 12/26/2014 0145  AST 1584*  ALT 194*  ALKPHOS 704*  BILITOT 5.6*  ALBUMIN 1.7*   Cardiac Enzymes  Recent Labs Lab 12/25/2014 0545  TROPONINI 0.41*   Glucose  Recent Labs Lab 12/09/2014 0316 12/13/2014 0343 12/01/2014 0755  GLUCAP 64* 147* 232*    Imaging Ct Abdomen  Pelvis Wo Contrast  2015/01/07   CLINICAL DATA:  Generalized abdominal pain, nausea, vomiting, weakness, hypertension, asthma  EXAM: CT ABDOMEN AND PELVIS WITHOUT CONTRAST  TECHNIQUE: Multidetector CT imaging of the abdomen and pelvis was performed following the standard protocol without IV contrast.  COMPARISON:  12/10/2011  FINDINGS: Small bibasilar pleural effusions and atelectasis.  Diffuse fatty infiltration and mild enlargement of liver.  Gallbladder surgically absent.  Streak artifacts traverse upper abdomen from patient's arms.  Small nonobstructing calculus inferior pole LEFT kidney.  No other focal abnormalities of the liver, spleen, pancreas, kidneys, or adrenal glands.  Normal appearance of uterus, adnexa and  ureters.  Bladder decompressed.  Normal appendix.  Minimal sigmoid diverticulosis.  Few air-filled mildly prominent small bowel loops in the anterior abdomen without definite evidence of bowel obstruction.  No mass, adenopathy, free air or free fluid.  Bones unremarkable.  IMPRESSION: Bibasilar small pleural effusions and atelectasis.  Diffuse fatty infiltration enlargement of liver.  Minimal sigmoid diverticulosis.  No definite acute intra-abdominal or intrapelvic abnormalities.   Electronically Signed   By: Ulyses Southward M.D.   On: January 07, 2015 03:35   Dg Chest Portable 1 View  07-Jan-2015   CLINICAL DATA:  Assess endotracheal tube after intubation.  EXAM: PORTABLE CHEST - 1 VIEW  COMPARISON:  Earlier the same day  FINDINGS: Endotracheal tube with tip just below the clavicular heads. The orogastric tube is difficult to visualize separate from the multiple wires, overlaps the stomach at least.  Worsening hypoventilation with streaky perihilar and basilar opacity. No edema, effusion, or air leak. Stable normal heart size for technique.  IMPRESSION: 1. New endotracheal and orogastric tubes are in good position. 2. Unchanged hypoventilation with bilateral atelectasis. Cannot exclude superimposed bronchopneumonia.   Electronically Signed   By: Marnee Spring M.D.   On: 01/07/15 04:22   Dg Chest Port 1 View  2015/01/07   CLINICAL DATA:  Hypertension, achy level failure, constant ongoing generalized weakness, nausea, shortness of breath, hypertension, asthma  EXAM: PORTABLE CHEST - 1 VIEW  COMPARISON:  Portable exam 0209 hr compared to 12/10/2011  FINDINGS: Normal heart size, mediastinal contours and pulmonary vascularity.  Elevation of RIGHT diaphragm with RIGHT basilar atelectasis.  Upper lungs clear.  No pleural effusion or pneumothorax.  IMPRESSION: Elevation of RIGHT diaphragm with RIGHT basilar atelectasis.   Electronically Signed   By: Ulyses Southward M.D.   On: Jan 07, 2015 03:14     ASSESSMENT /  PLAN:  PULMONARY OETT 9/21 >>> A: Acute respiratory failure due to inability to protect airway Elevated R hemidiaphragm  P:   Vent support CXR in am  ABG reviewed, rate to 35 Repeat ABg after A line placed VAP bundle BDs  CARDIOVASCULAR CVL R fem placed sterile in ED 9/21 >>> A:  Shock suspect septic Cardiac arrest Elevated troponin > suspect demand ischemia H/o HTN R/o rel AI P:  Tele MAP goal > 65 Levophed for MAP goal Volume/Blood product administration.  Hold home amlodipine, losartan Trend lactic further Trend troponin Place Arterial line STAT, Bp dropping  STAT cortisol Need cvp, place neck line  RENAL A:   Acute Kidney Injury > suspect pre-renal Hypokalemia Hyponatremia > beer potomania High AG metabolic acidosis - lactic Hemoccult negative  P:   Bmet q 4 Bicarb ok for small NON AG noted, reduce Small k supp May need CRRT if unable to improve, as refractory acidosis and NO urine, will consult renal and consider cvvhd  GASTROINTESTINAL A:   Hepatic failure - cirrhosis +  shock component R/o ischemic abdo ?Cirrhosis ETOH abuse  P:   NPO Trend LFT Get amy Acute hep panel Consider surgical evaluation, ex lap?  HEMATOLOGIC A:   Coagulopathy r/t hepatic failure, sepsis Anemia  P:  Vit K FFP x 4 stat for lines Follow CBC Transfuse per ICU guidelines scd  INFECTIOUS A:   Severe sepsis No SBP as No fluid R/o abdominal sepsis  P:   BCx2 9/21 > UC 9/21 > Sputum 9/21 > Abx: Ceftaz, start date 9/21 > Abx: Vanc, start date 9/21 > PCT  Add empiric anigulofungin Add flagyl  [place] abdo Consider surgical evaluation  ENDOCRINE A:   Hypothyroid R/o rel Ai  P:   Cortisol then stress roids CBG monitoring and SSI   NEUROLOGIC A:   Acute metabolic encephalopathy, septic . hepatic component?  P:   RASS goal: 0 Fentanyl infusion to deeer rass PRN versed, but avoid if able Will need lactulose once stable   FAMILY  - Updates:  no family available  - Inter-disciplinary family meet or Palliative Care meeting due by:  9/28 >>>   STAFF NOTE: I, Rory Percy, MD FACP have personally reviewed patient's available data, including medical history, events of note, physical examination and test results as part of my evaluation. I have discussed with resident/NP and other care providers such as pharmacist, RN and RRT. In addition, I personally evaluated patient and elicited key findings of: mottled up to abdomen, tender abdomen, MODS, high risk death, I am concerned for abdominal source sepsis, ischemic bowel>?, there is NO Ascites to cause SBP, increase rate vent repeat abg, replace aline, add epi to levo 75, vaso, get cortisol, give stress roids, get LDH, repeat LActic acid, need deeper rass, regardless of the NON contrast CT findings, it is concerning for abdominal source, ischemia etc, surgery was called and appreciated, place HD cath start cvvhd, add empiric antifungal, add flagyl to ceftaz, no family to update, ffp for lines , vit K , get fibrinogen The patient is critically ill with multiple organ systems failure and requires high complexity decision making for assessment and support, frequent evaluation and titration of therapies, application of advanced monitoring technologies and extensive interpretation of multiple databases.   Critical Care Time devoted to patient care services described in this note is 45 Minutes. This time reflects time of care of this signee: Rory Percy, MD FACP. This critical care time does not reflect procedure time, or teaching time or supervisory time of PA/NP/Med student/Med Resident etc but could involve care discussion time. Rest per NP/medical resident whose note is outlined above and that I agree with   [name]. [name], MD, FACP Pgr: [phone]  Pulmonary & Critical Care 12/30/2014 10:32 AM

## 2014-12-21 NOTE — ED Notes (Signed)
Raj Janus, RN transporting with patient to CT3. Reported to Dr. Elesa Massed troponin of 0.41.

## 2014-12-21 NOTE — Procedures (Signed)
Central Venous Catheter Insertion Procedure Note SHARNA GABRYS [id number] 1970-12-17  Procedure: Insertion of Central Venous Catheter Indications: Assessment of intravascular volume, Drug and/or fluid administration and Frequent blood sampling  Procedure Details Consent: Unable to obtain consent because of emergent medical necessity. Time Out: Verified patient identification, verified procedure, site/side was marked, verified correct patient position, special equipment/implants available, medications/allergies/relevent history reviewed, required imaging and test results available.  Performed  Maximum sterile technique was used including antiseptics, cap, gloves, gown, hand hygiene, mask and sheet. Skin prep: Chlorhexidine; local anesthetic administered A antimicrobial bonded/coated triple lumen catheter was placed in the right femoral vein due to emergent situation using the Seldinger technique. Ultrasound guidance used.Yes.   Catheter placed to 20 cm. Blood aspirated via all 3 ports and then flushed x 3. Line sutured x 2 and dressing applied.  Evaluation Blood flow good Complications: No apparent complications Patient did tolerate procedure well. Chest X-ray ordered to verify placement.  CXR: pending.  Joneen Roach, AGACNP-BC [place] [facility] Pager [phone] or [phone]  I was present and supervised the entire procedure.  U/S used in placement.  Alyson Reedy, M.D. [facility] Pulmonary/Critical Care Medicine. Pager: [phone]. After hours pager: [phone].  12/12/2014 5:52 AM

## 2014-12-21 NOTE — ED Notes (Signed)
1 amp of bicarb given.

## 2014-12-21 NOTE — Progress Notes (Signed)
[facility] Physician-Brief Progress Note Patient Name: [name] [name] DOB: [birth year] MRN: [id number]   Date of Service  01/04/2015  HPI/Events of Note  ABG not drawn yet. BP = ???. HR now in 50's.   eICU Interventions  Will order NaHCO3 100 meq IV now.      Intervention Category Major Interventions: Sepsis - evaluation and management;Shock - evaluation and management;Hypotension - evaluation and management  Lenell Antu 01/04/2015, 4:42 PM

## 2014-12-21 NOTE — Progress Notes (Signed)
Patient's time of death was 21:37.  RT removed patient from ventilator and extubated at 21:40.

## 2014-12-21 NOTE — Progress Notes (Signed)
CRITICAL VALUE ALERT  Critical value received:  Calcium, 5.4, Lactic acid 7.4, Troponin 1., Lactic acid 12.1  Date of notification:  01/12/2015  Time of notification:  0715, 0800, 1050, 1235 (respectively)  Critical value read back: yes  Nurse who received alert:  Irven Coe RN  MD notified (1st page):  MD notified at bedside as results were called in by lab.  Time of first page:  MD notified at bedside as results were called by lab   MD notified (2nd page):  Time of second page:  Responding MD:  Dr. Micah Flesher  Time MD responded:  At bedside

## 2014-12-22 LAB — PREPARE FRESH FROZEN PLASMA
UNIT DIVISION: 0
UNIT DIVISION: 0
UNIT DIVISION: 0
UNIT DIVISION: 0
Unit division: 0
Unit division: 0
Unit division: 0

## 2014-12-22 LAB — HEPATITIS PANEL, ACUTE
HEP A IGM: NEGATIVE
HEP B C IGM: NEGATIVE
Hepatitis B Surface Ag: NEGATIVE

## 2014-12-22 LAB — URINE CULTURE: Culture: NO GROWTH

## 2014-12-22 MED FILL — Medication: Qty: 1 | Status: AC

## 2014-12-23 ENCOUNTER — Telehealth: Payer: Self-pay

## 2014-12-23 NOTE — Telephone Encounter (Signed)
On 12-28-14 I received a death certificate from [facility]. The death certificate is for cremation. The patient is a patient of Doctor [name]. The death certificate will be taken to [facility] on Monday for signature due to Doctor [name] is  On 2014-12-28 I received the death certificate back from Doctor Marchelle Gearing who signed it for Doctor [name]. I got the death certificate ready for pickup and called the funeral home to let them know the death certificate is ready for pickup.

## 2014-12-26 LAB — CULTURE, BLOOD (ROUTINE X 2)
CULTURE: NO GROWTH
Culture: NO GROWTH

## 2014-12-29 ENCOUNTER — Telehealth: Payer: Self-pay | Admitting: Adult Health

## 2014-12-29 NOTE — Telephone Encounter (Signed)
Spoke with Dr. [name].  Will route msg to his inbox.

## 2014-12-29 NOTE — Telephone Encounter (Signed)
Called and spoke with pt's sister, Earl Lagos stated that Dr Bary Richard took care of her sister in the hospital who is now deceased Sister was concerned about the cause of death on death certificate stating ETOH abuse Sister states that she knew her sister had cirrhosis but was told years ago it was related to vitamin malabsorption Sister is upset because she feels that Dr assumed the ETOH abuse and wants to know if there was something that he found to make him sure that it was ETOH abuse  Sister states that if it was an assumption she would like this taken off of her sister's death certificate.

## 2014-12-30 NOTE — Telephone Encounter (Signed)
Pt sister Harlon Ditty, [phone]

## 2014-12-30 NOTE — Telephone Encounter (Signed)
Called and spoke with [name] [name] Informed her that message had been sent to Dr [name] yesterday and we are still waiting to hear back from him Informed that as soon as he responded the office would call her back

## 2014-12-31 NOTE — Care Management Note (Signed)
Case Management Note  Patient Details  Name: [name] [name] MRN: [id number] Date of Birth: 1971-01-29  Subjective/Objective:                    Action/Plan:   Expected Discharge Date:                  Expected Discharge Plan:  Home/Self Care  In-House Referral:     Discharge planning Services  CM Consult  Post Acute Care Choice:    Choice offered to:     DME Arranged:    DME Agency:     HH Arranged:    HH Agency:     Status of Service:  Completed, signed off  Medicare Important Message Given:    Date Medicare IM Given:    Medicare IM give by:    Date Additional Medicare IM Given:    Additional Medicare Important Message give by:     If discussed at Long Length of Stay Meetings, dates discussed:    Additional Comments:  Vangie Bicker, RN 12/13/2014, 7:24 AM

## 2014-12-31 DEATH — deceased

## 2015-01-04 NOTE — Discharge Summary (Signed)
NAMEASLAN, [name] NO.:  [id number]  MEDICAL RECORD NO.:  [id number]  LOCATION:  2M08C                        FACILITY:  [facility]  PHYSICIAN:  [name] Bucks, MD DATE OF BIRTH:  Jun 12, 1970  DATE OF ADMISSION:  12/12/2014 DATE OF DISCHARGE:  Dec 23, 2014                              DISCHARGE SUMMARY   DEATH SUMMARY  This is a 44 year old female with history of cirrhosis, although on the record in two places is this listed as alcoholic related after further discussions with the sister, this has not been confirmed from the family.  The patient presented with weakness, hypotension, initially in the emergency room, improved with some volume, she deteriorated, had a brief cardiac arrest in the emergency room, intubated, presumed sepsis with abdominal source.  The patient was intubated and brought to the Critical Care Unit under Dr. Percival Spanish service.  Studies involved CT scan showing small bibasilar effusions, fatty infiltration of the liver, diverticulosis, no definitive intraabdominal abnormality.  Significant events of the hospitalization including an admission date on 12/10/2014 with admission for cardiac arrest, status post concerns of sepsis and hypotension and multiorgan dysfunction syndrome and on 12/24/2014, she basically had refractory shock with escalating massive doses of Levophed, epinephrine was utilized.  The patient had worsening renal failure, multiorgan dysfunction syndrome, had a dialysis catheter placed for CVVHD, required a high-minute ventilation on the ventilator machine. She had cortisol level sent, steroids provided.  She had lines placed to obtain CVPs.  Had Renal consultation with initiation of CRT as the goal. There was concern still of the apnea being the source or even related to the bowel ischemia despite a CT scan without contrast utilization, but unfortunately, the patient rapidly went into worsening of failure multiorgan system  secondary to overwhelming likely SIRS and septic shock, and suffered a cardiac arrest.  Prior to this and during the cardiac arrest, we had discussions with the husband on telephone, who stated she would not want to be resuscitated again.  Fortunately, we are able to resuscitate her in the setting of epinephrine, maxed out Levophed and empiric antibiotics including empiric anidulafungin. Ultrasound of the abdomen was ordered and we wanted to have a surgical evaluation, which was obtained to consider exploratory laparotomy, but she had the sudden declines leading to cardiac arrest.  In spite of continued aggressive heroic measures, the patient expired with the wishes of the husband not to have recurrent CPR.  FINAL DIAGNOSES UPON DEATH: 1. Likely abdominal source of septic shock, rule out ischemic bowel. 2. Cirrhosis of unclear etiology. 3. MODS (multiorgan dysfunction syndrome). 4. Acute renal failure. 5. Acute respiratory failure. 6. Septic shock. 7. Acute liver dysfunction.     [name] Bucks, MD     DJF/MEDQ  D:  01/03/2015  T:  01/04/2015  Job:  [id number]

## 2017-08-30 IMAGING — CR DG CHEST 1V PORT
1 series · 1 of 1 positions shown · non-contrast
Comparison: Portable exam 0407 hr compared to 12/10/2011

CLINICAL DATA: Hypertension, achy level failure, constant ongoing
generalized weakness, nausea, shortness of breath, hypertension,
asthma

EXAM:
PORTABLE CHEST - 1 VIEW

[AP]
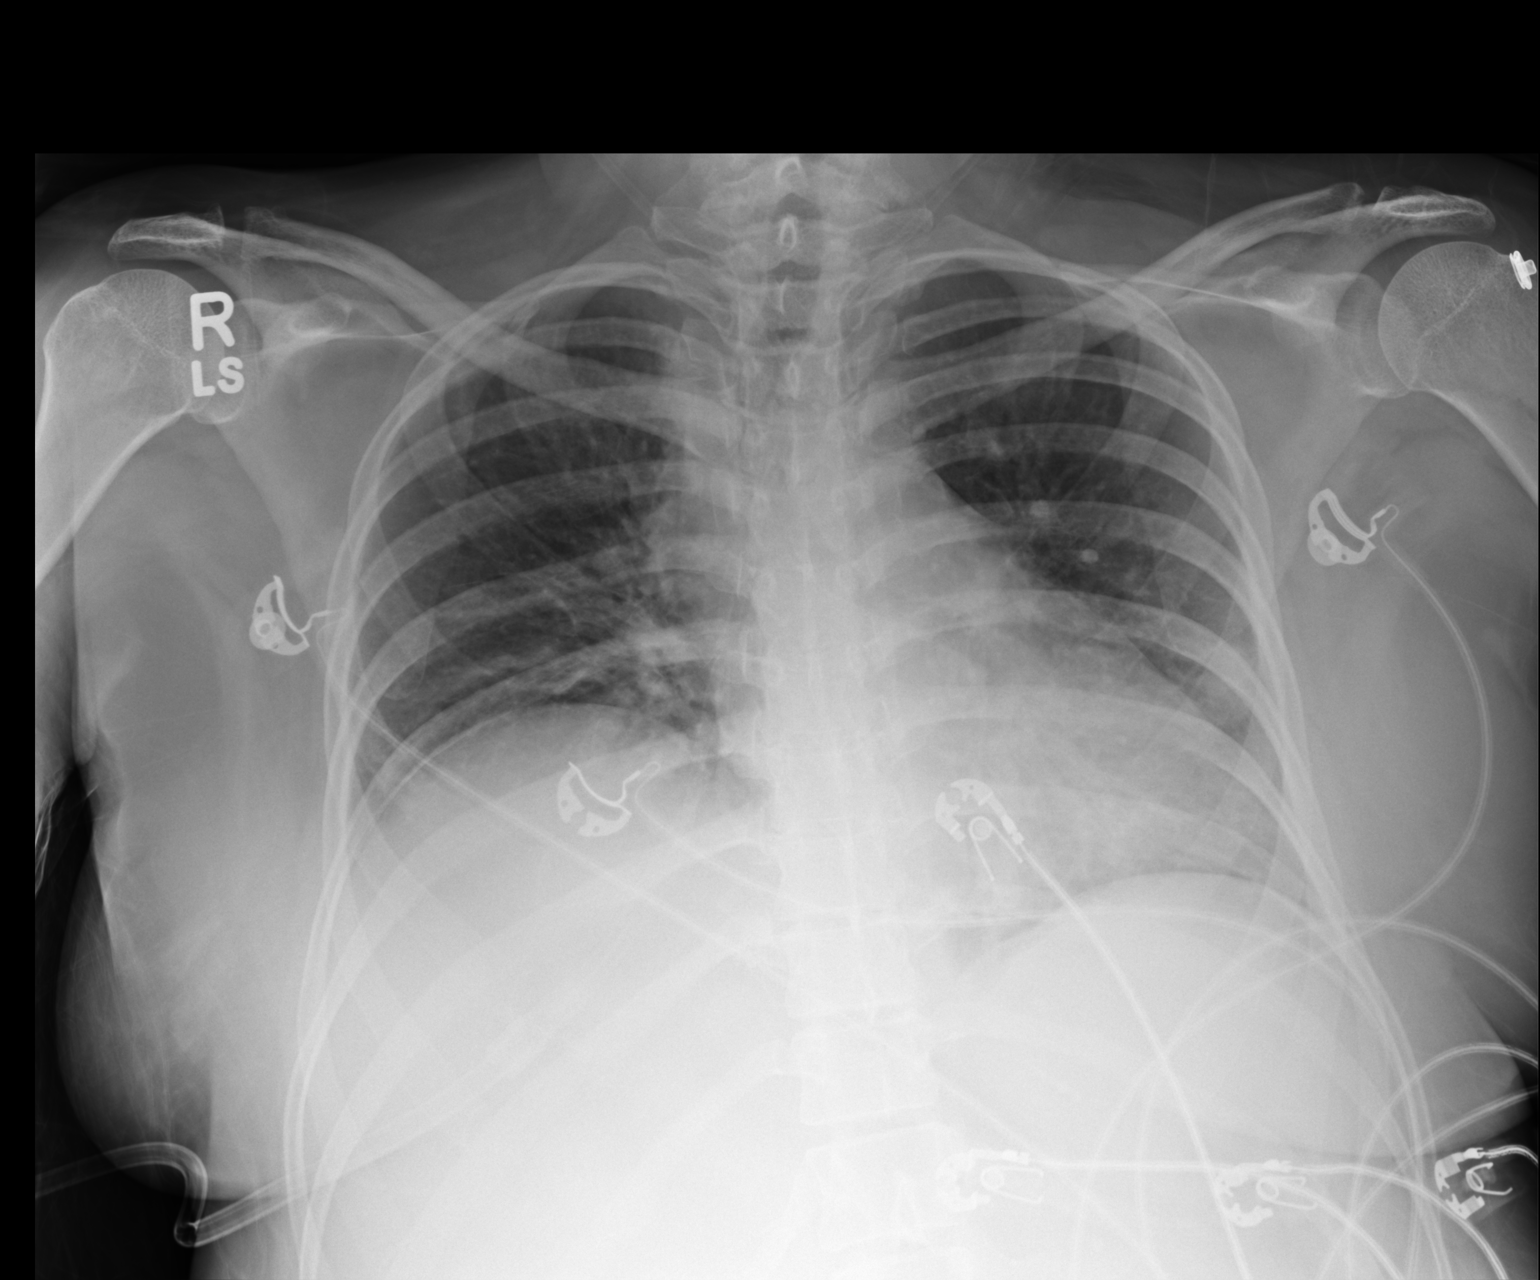

[1 of 1 positions shown; findings below may reference images not displayed]

FINDINGS: Normal heart size, mediastinal contours and pulmonary vascularity.

Elevation of RIGHT diaphragm with RIGHT basilar atelectasis.

Upper lungs clear.

No pleural effusion or pneumothorax.
IMPRESSION: Elevation of RIGHT diaphragm with RIGHT basilar atelectasis.

## 2017-08-30 IMAGING — CT CT ABD-PELV W/O CM
2 of 5 series · 16 of 46 positions shown, 18 images · non-contrast
Comparison: 12/10/2011

CLINICAL DATA: Generalized abdominal pain, nausea, vomiting,
weakness, hypertension, asthma

EXAM:
CT ABDOMEN AND PELVIS WITHOUT CONTRAST
TECHNIQUE: Multidetector CT imaging of the abdomen and pelvis was performed
following the standard protocol without IV contrast.

[Series 2: abd/ pelvis 5.0 i30f 1 · axial · 0.76mm/px · z∈[-464,+11]mm · 13 of 105 slices shown, 15 images]
[im 5/105  soft-tissue]
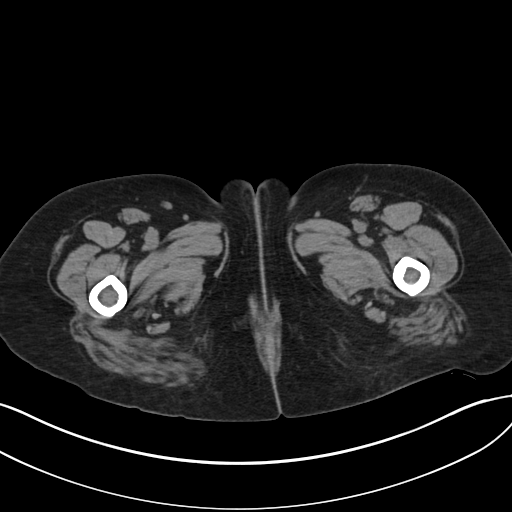
[im 5/105  bone]
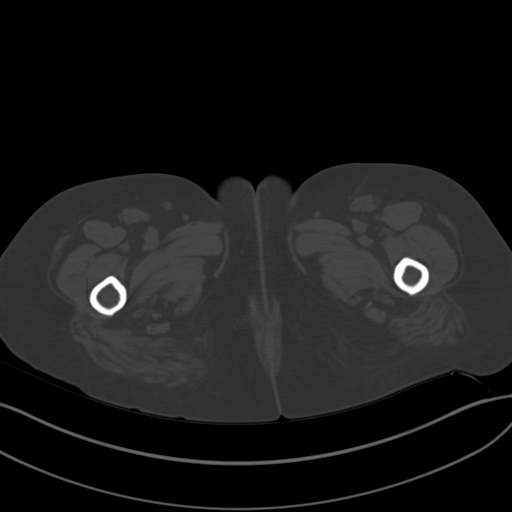
[im 14/105  soft-tissue]
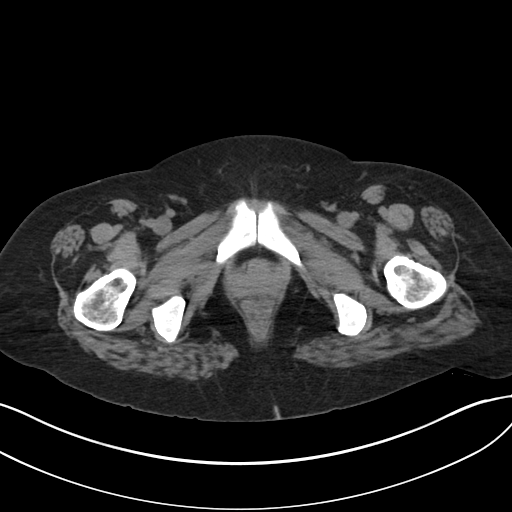
[im 22/105  soft-tissue]
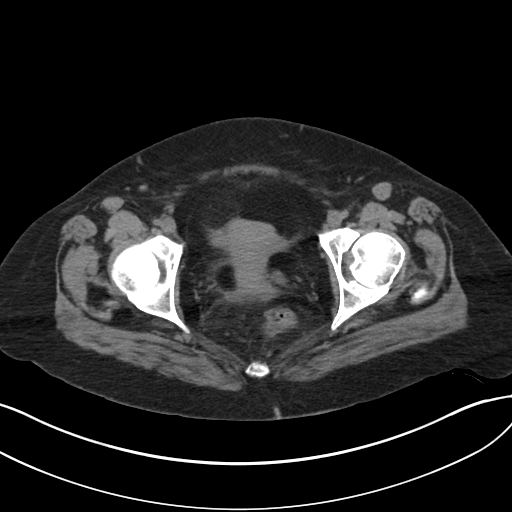
[im 31/105  soft-tissue]
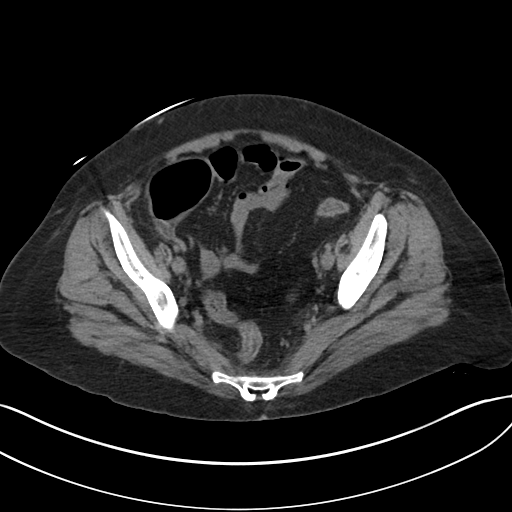
[im 35/105  soft-tissue]
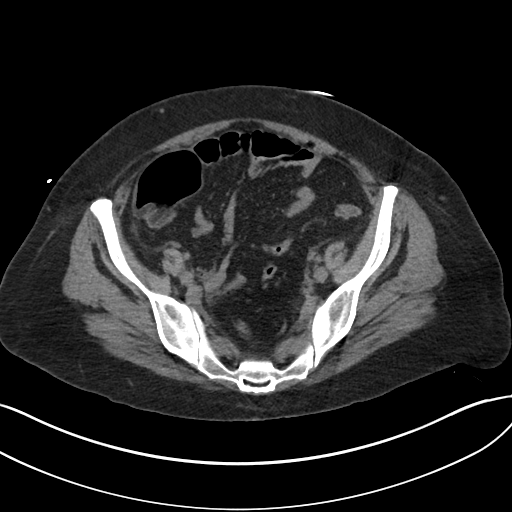
[im 44/105  soft-tissue]
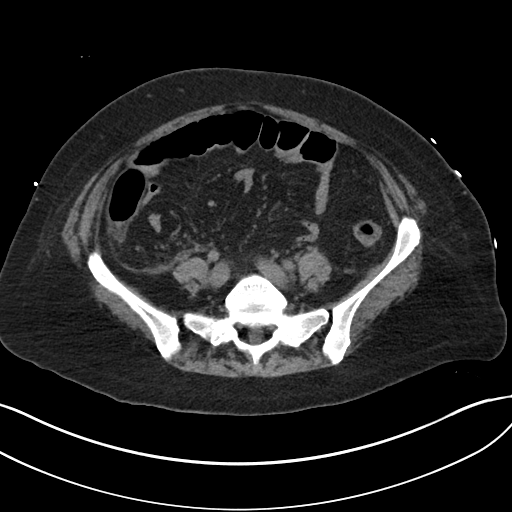
[im 53/105  soft-tissue]
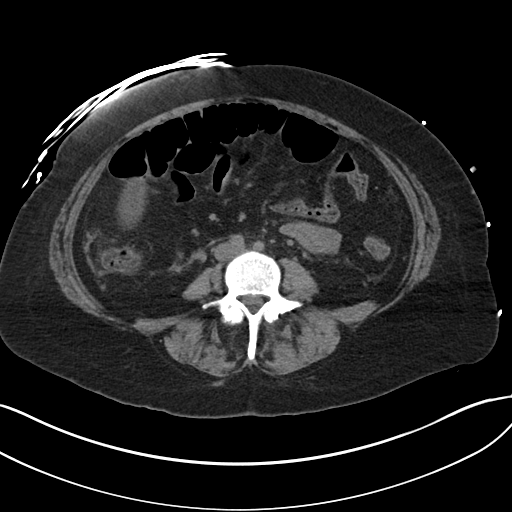
[im 61/105  soft-tissue]
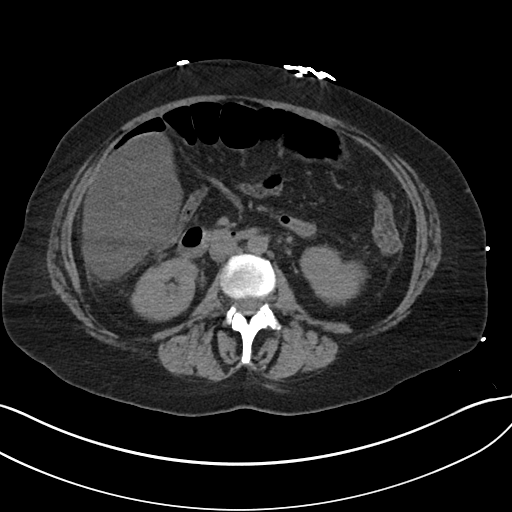
[im 70/105  soft-tissue]
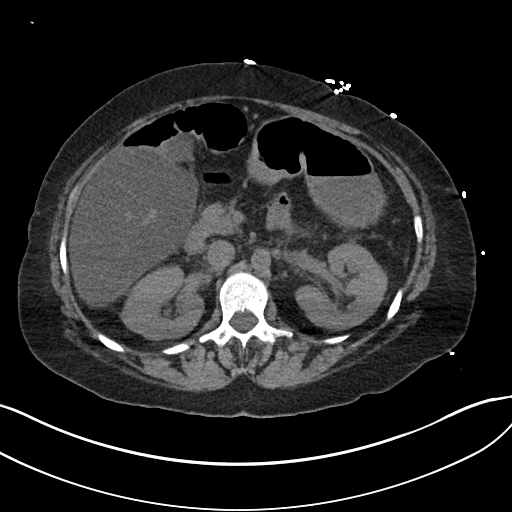
[im 70/105  bone]
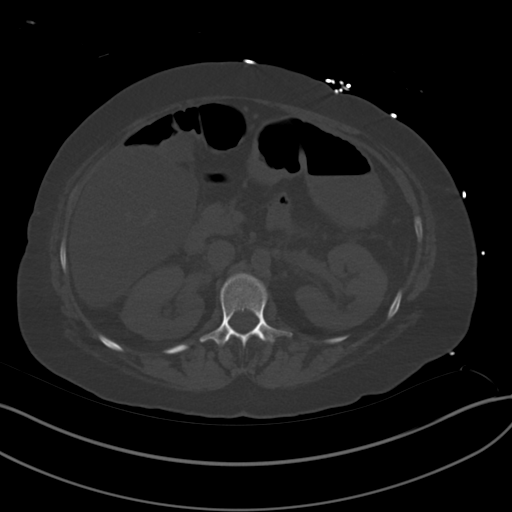
[im 74/105  soft-tissue]
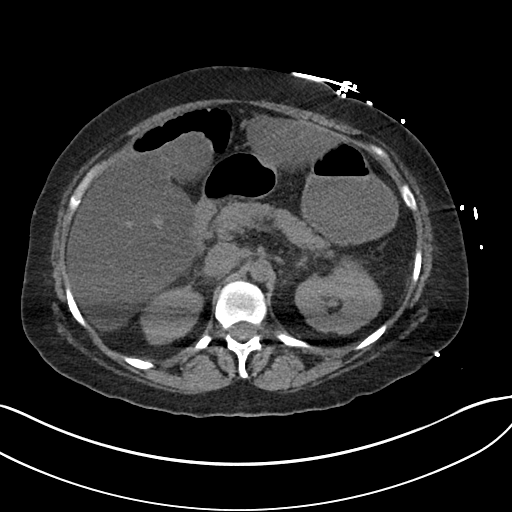
[im 83/105  soft-tissue]
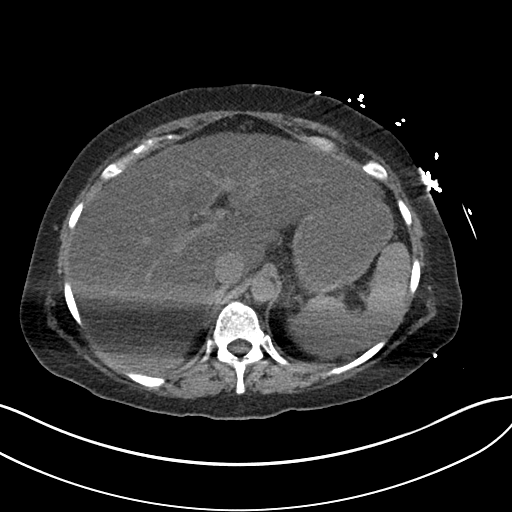
[im 92/105  soft-tissue]
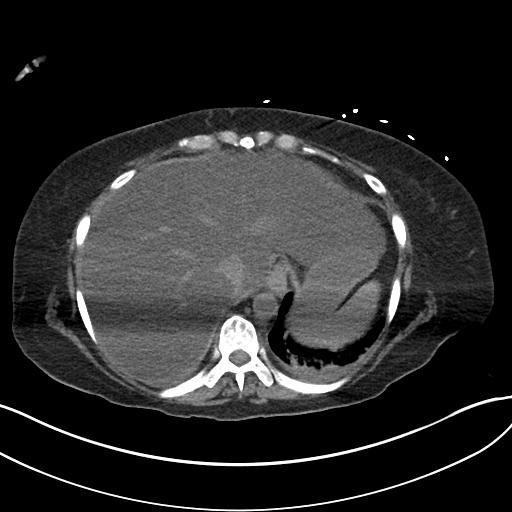
[im 100/105  soft-tissue]
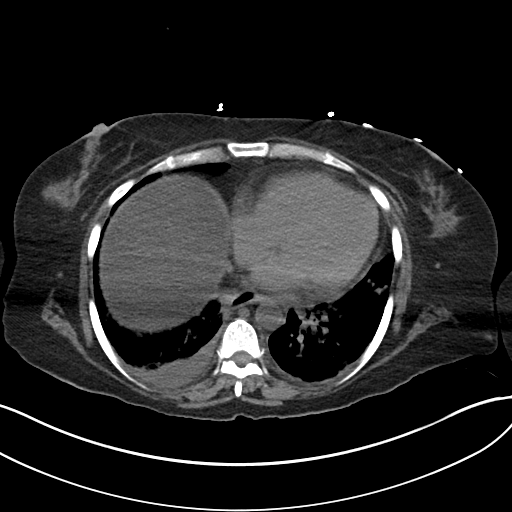

[Series 5: cor st · coronal · 0.81mm/px · 3 of 87 slices shown]
[im 29/87  soft-tissue]
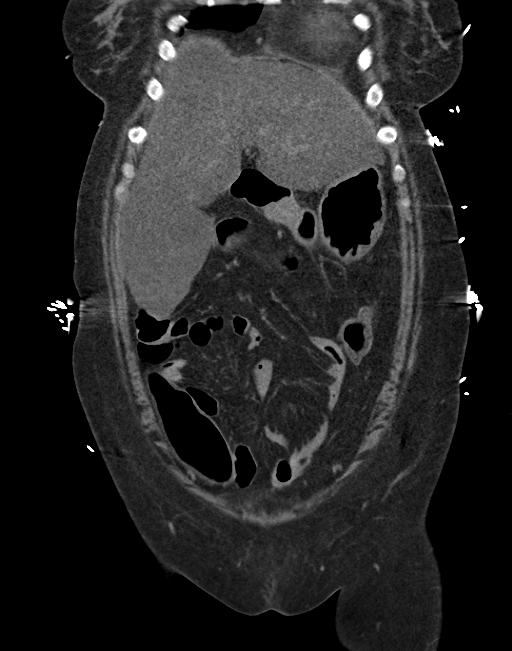
[im 39/87  soft-tissue]
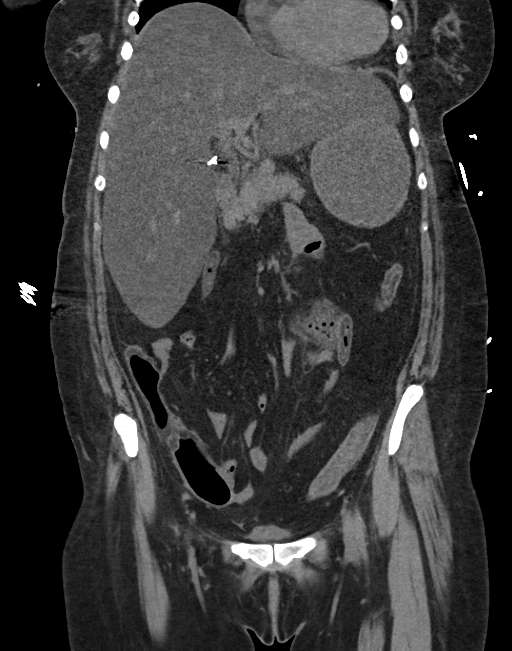
[im 48/87  soft-tissue]
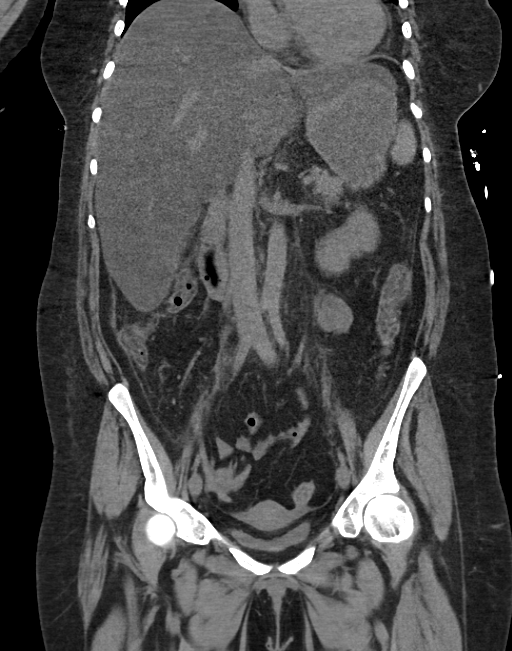

[16 of 46 positions shown; findings below may reference images not displayed]

FINDINGS: Small bibasilar pleural effusions and atelectasis.

Diffuse fatty infiltration and mild enlargement of liver.

Gallbladder surgically absent.

Streak artifacts traverse upper abdomen from patient's arms.

Small nonobstructing calculus inferior pole LEFT kidney.

No other focal abnormalities of the liver, spleen, pancreas,
kidneys, or adrenal glands.

Normal appearance of uterus, adnexa and ureters.

Bladder decompressed.

Normal appendix.

Minimal sigmoid diverticulosis.

Few air-filled mildly prominent small bowel loops in the anterior
abdomen without definite evidence of bowel obstruction.

No mass, adenopathy, free air or free fluid.

Bones unremarkable.
IMPRESSION: Bibasilar small pleural effusions and atelectasis.

Diffuse fatty infiltration enlargement of liver.

Minimal sigmoid diverticulosis.

No definite acute intra-abdominal or intrapelvic abnormalities.
